# Patient Record
Sex: Male | Born: 1945 | Race: White | Hispanic: No | Marital: Married | Smoking: Former smoker
Health system: Southern US, Community
[De-identification: ages and names within clinical notes are randomized; demographics above are authoritative.]

## PROBLEM LIST (undated history)

## (undated) DIAGNOSIS — F419 Anxiety disorder, unspecified: Secondary | ICD-10-CM

## (undated) DIAGNOSIS — I209 Angina pectoris, unspecified: Secondary | ICD-10-CM

## (undated) DIAGNOSIS — E785 Hyperlipidemia, unspecified: Secondary | ICD-10-CM

## (undated) DIAGNOSIS — C801 Malignant (primary) neoplasm, unspecified: Secondary | ICD-10-CM

## (undated) DIAGNOSIS — R569 Unspecified convulsions: Secondary | ICD-10-CM

## (undated) DIAGNOSIS — I10 Essential (primary) hypertension: Secondary | ICD-10-CM

## (undated) DIAGNOSIS — F4310 Post-traumatic stress disorder, unspecified: Secondary | ICD-10-CM

## (undated) HISTORY — PX: CHOLECYSTECTOMY: SHX55

## (undated) HISTORY — DX: Essential (primary) hypertension: I10

## (undated) HISTORY — DX: Hyperlipidemia, unspecified: E78.5

## (undated) HISTORY — DX: Angina pectoris, unspecified: I20.9

## (undated) HISTORY — PX: CARDIAC CATHETERIZATION: SHX172

## (undated) HISTORY — DX: Unspecified convulsions: R56.9

---

## 2007-09-29 ENCOUNTER — Ambulatory Visit: Payer: Self-pay | Admitting: Family Medicine

## 2007-09-29 DIAGNOSIS — L299 Pruritus, unspecified: Secondary | ICD-10-CM | POA: Insufficient documentation

## 2007-09-29 DIAGNOSIS — L259 Unspecified contact dermatitis, unspecified cause: Secondary | ICD-10-CM

## 2007-10-12 ENCOUNTER — Ambulatory Visit: Payer: Self-pay | Admitting: Family Medicine

## 2007-10-15 ENCOUNTER — Telehealth: Payer: Self-pay | Admitting: Family Medicine

## 2008-05-31 ENCOUNTER — Ambulatory Visit: Payer: Self-pay | Admitting: Family Medicine

## 2008-05-31 DIAGNOSIS — L255 Unspecified contact dermatitis due to plants, except food: Secondary | ICD-10-CM

## 2008-06-05 ENCOUNTER — Telehealth: Payer: Self-pay | Admitting: Family Medicine

## 2008-06-06 ENCOUNTER — Encounter: Payer: Self-pay | Admitting: Family Medicine

## 2008-11-08 ENCOUNTER — Ambulatory Visit: Payer: Self-pay | Admitting: Family Medicine

## 2008-11-08 DIAGNOSIS — L570 Actinic keratosis: Secondary | ICD-10-CM

## 2008-12-20 ENCOUNTER — Ambulatory Visit: Payer: Self-pay | Admitting: Family Medicine

## 2008-12-20 DIAGNOSIS — L2089 Other atopic dermatitis: Secondary | ICD-10-CM

## 2008-12-21 ENCOUNTER — Encounter: Payer: Self-pay | Admitting: Family Medicine

## 2009-01-04 ENCOUNTER — Ambulatory Visit: Payer: Self-pay | Admitting: Family Medicine

## 2009-01-10 ENCOUNTER — Ambulatory Visit: Payer: Self-pay | Admitting: Family Medicine

## 2009-01-10 DIAGNOSIS — I209 Angina pectoris, unspecified: Secondary | ICD-10-CM

## 2009-01-10 DIAGNOSIS — R7301 Impaired fasting glucose: Secondary | ICD-10-CM

## 2009-01-10 DIAGNOSIS — I10 Essential (primary) hypertension: Secondary | ICD-10-CM

## 2009-01-15 ENCOUNTER — Encounter: Payer: Self-pay | Admitting: Family Medicine

## 2009-01-15 LAB — CONVERTED CEMR LAB
ALT: 39 units/L (ref 0–53)
Albumin: 4.6 g/dL (ref 3.5–5.2)
CO2: 23 meq/L (ref 19–32)
Chloride: 95 meq/L — ABNORMAL LOW (ref 96–112)
Cholesterol: 193 mg/dL (ref 0–200)
LDL Cholesterol: 85 mg/dL (ref 0–99)
MCV: 87 fL (ref 78.0–100.0)
PSA: 0.91 ng/mL (ref 0.10–4.00)
Platelets: 149 10*3/uL — ABNORMAL LOW (ref 150–400)
RDW: 12.3 % (ref 11.5–15.5)
Sodium: 132 meq/L — ABNORMAL LOW (ref 135–145)
Total Bilirubin: 0.5 mg/dL (ref 0.3–1.2)
Total Protein: 7.2 g/dL (ref 6.0–8.3)
Triglycerides: 316 mg/dL — ABNORMAL HIGH (ref <150)
VLDL: 63 mg/dL — ABNORMAL HIGH (ref 0–40)
WBC: 5.4 10*3/uL (ref 4.0–10.5)

## 2009-01-24 ENCOUNTER — Telehealth: Payer: Self-pay | Admitting: Family Medicine

## 2009-05-03 ENCOUNTER — Ambulatory Visit: Payer: Self-pay | Admitting: Family Medicine

## 2009-10-24 ENCOUNTER — Ambulatory Visit: Payer: Self-pay | Admitting: Family Medicine

## 2009-10-24 DIAGNOSIS — IMO0002 Reserved for concepts with insufficient information to code with codable children: Secondary | ICD-10-CM | POA: Insufficient documentation

## 2009-10-24 DIAGNOSIS — M7500 Adhesive capsulitis of unspecified shoulder: Secondary | ICD-10-CM

## 2009-10-27 ENCOUNTER — Encounter: Admission: RE | Admit: 2009-10-27 | Discharge: 2009-10-27 | Payer: Self-pay | Admitting: Sports Medicine

## 2010-10-08 NOTE — Assessment & Plan Note (Signed)
Summary: R SHOULDER PAIN/WB   Vital Signs:  Patient Profile:   65 Years Old Male CC:      Rt shoulder pain from fall Christmas day Height:     66 inches Weight:      191 pounds O2 Sat:      98 % O2 treatment:    Room Air Temp:     97.7 degrees F oral Pulse rate:   47 / minute Pulse rhythm:   regular Resp:     16 per minute BP sitting:   147 / 83  (left arm) Cuff size:   regular  Pt. in pain?   yes    Location:   shoulder    Intensity:   8    Type:       aching  Vitals Entered By: Avel Sensor, CMA                   Prior Medication List:  TEGRETOL XR 200 MG  TB12 (CARBAMAZEPINE) take three tabs by mouth two times a day LEVETIRACETAM 500 MG  TABS (LEVETIRACETAM) take two tabs by mouth two times a day CLONAZEPAM 1 MG  TABS (CLONAZEPAM) Take 1 tablet by mouth two times a day ATENOLOL 100 MG  TABS (ATENOLOL) Take 1 tablet by mouth once a day SIMVASTATIN 80 MG  TABS (SIMVASTATIN) Take 1 tablet by mouth once a day NITRO-DUR 0.4 MG/HR  PT24 (NITROGLYCERIN) apply patch daily VENLAFAXINE HCL 75 MG  TABS (VENLAFAXINE HCL) take three tabs by mouth daily * EUCERIN: TRIAMCINOLONE 0.5% CREAM 1:1 apoly to rash two times a day x 7 days HYDROXYZINE HCL 50 MG TABS (HYDROXYZINE HCL) 1-2 tabs by mouth at bedtime as needed itching   Current Allergies (reviewed today): ! HYDROCODONEHistory of Present Illness Chief Complaint: Rt shoulder pain from fall Christmas day History of Present Illness: Patient fell almost 2 months . He reports pain w/ a throwing motion. He still has limited ability in his R shoulder.   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) DERMATITIS, ATOPIC (ICD-691.8) DERMATITIS, CHRONIC (ICD-692.9) ACTINIC KERATOSIS (ICD-702.0) POISON IVY  DERMATITIS (ICD-692.6) CONTACT DERMATITIS (ICD-692.9) PRURITUS (ICD-698.9)   Current Meds TEGRETOL XR 200 MG  TB12 (CARBAMAZEPINE) take three tabs by mouth two times a day LEVETIRACETAM 500 MG  TABS (LEVETIRACETAM) take two tabs by mouth two times a day CLONAZEPAM 1 MG  TABS (CLONAZEPAM) Take 1 tablet by mouth two times a day ATENOLOL 100 MG  TABS (ATENOLOL) Take 1 tablet by mouth once a day SIMVASTATIN 80 MG  TABS (SIMVASTATIN) Take 1 tablet by mouth once a day NITRO-DUR 0.4 MG/HR  PT24 (NITROGLYCERIN) apply patch daily VENLAFAXINE HCL 75 MG  TABS (VENLAFAXINE HCL) take three tabs by mouth daily * EUCERIN: TRIAMCINOLONE 0.5% CREAM 1:1 apoly to rash two times a day x 7 days HYDROXYZINE HCL 50 MG TABS (HYDROXYZINE HCL) 1-2 tabs by mouth at bedtime as needed itching OMEPRAZOLE 20 MG CPDR (OMEPRAZOLE)  FLUNISOLIDE 0.025 % SOLN (FLUNISOLIDE)  CRESTOR 10 MG TABS (ROSUVASTATIN CALCIUM)   REVIEW OF SYSTEMS Constitutional Symptoms      Denies fever, chills, night sweats, weight loss, weight gain, and fatigue.  Eyes       Denies change in vision, eye pain, eye discharge, glasses, contact lenses, and eye surgery. Ear/Nose/Throat/Mouth       Denies hearing loss/aids, change in hearing,  ear pain, ear discharge, dizziness, frequent runny nose, frequent nose bleeds, sinus problems, sore throat, hoarseness, and tooth pain or bleeding.  Respiratory       Denies dry cough, productive cough, wheezing, asthma, bronchitis, and emphysema/COPD.  Cardiovascular       Denies murmurs, chest pain, and tires easily with exhertion.    Gastrointestinal       Denies stomach pain, nausea/vomiting, diarrhea, constipation, blood in bowel movements, and indigestion. Genitourniary       Denies painful urination, kidney stones, and loss of urinary control. Neurological       Denies paralysis, seizures, and fainting/blackouts. Musculoskeletal       Complains of muscle pain, joint pain, joint stiffness, and  decreased range of motion.      Denies redness, swelling, muscle weakness, and gout.  Skin       Denies bruising, unusual mles/lumps or sores, and hair/skin or nail changes.  Psych       Denies mood changes, temper/anger issues, anxiety/stress, speech problems, depression, and sleep problems.  Past History:  Family History: Last updated: 10/25/2007 mother died 89 old age father died 55, AMI, DM 2 brothers, 1 died, unknown cause 3 sisters - 1 w/ DM, 1 w/ colon cancer and skin cancer  Social History: Last updated: 10/25/07 Retired from [employer]  in [place] Married to [place]. No kids. Has 2 dogs. Quit smoking 15 yrs ago. Denies ETOH. Poor diet. No exericse.  Risk Factors: Caffeine Use: 2 (10/25/2007)  Risk Factors: Smoking Status: quit (10/25/2007)  Past Medical History: Reviewed history from 12/20/2008 and no changes required. DM, diet- controlled. Epilepsy -- neuro in [place] High chol HTN Angina  goes to [place]  Past Surgical History: Reviewed history from Oct 25, 2007 and no changes required. heart cath 1997, no stent or angio  Family History: Reviewed history from 2007-10-25 and no changes required. mother died 27 old age father died 39, AMI, DM 2 brothers, 1 died, unknown cause 3 sisters - 1 w/ DM, 1 w/ colon cancer and skin cancer  Social History: Reviewed history from 10/25/07 and no changes required. Retired from [employer]  in [place] Married to [place]. No kids. Has 2 dogs. Quit smoking 15 yrs ago. Denies ETOH. Poor diet. No exericse. 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  Plan New Orders: New Patient Level III [99203] T-DG Shoulder*R* [73030] Follow Up: tomorrow w/orthopedic  The patient and/or caregiver has been counseled thoroughly with regard to medications prescribed including dosage, schedule, interactions, rationale for use, and possible side effects and they verbalize understanding.  Diagnoses and expected course of recovery discussed and will return if not improved as expected or if the condition worsens. Patient and/or caregiver verbalized understanding.   Patient Instructions: 1)  folow up w/ Ortho tomorrow.

## 2010-10-16 ENCOUNTER — Encounter: Payer: Self-pay | Admitting: Family Medicine

## 2010-10-16 ENCOUNTER — Other Ambulatory Visit: Payer: Self-pay | Admitting: Family Medicine

## 2010-10-16 ENCOUNTER — Ambulatory Visit (INDEPENDENT_AMBULATORY_CARE_PROVIDER_SITE_OTHER): Payer: Medicare Other | Admitting: Family Medicine

## 2010-10-16 ENCOUNTER — Ambulatory Visit
Admission: RE | Admit: 2010-10-16 | Discharge: 2010-10-16 | Disposition: A | Discharge status: Home / Self Care | Payer: Medicare Other | Source: Ambulatory Visit | Attending: Family Medicine | Admitting: Family Medicine

## 2010-10-16 DIAGNOSIS — R05 Cough: Secondary | ICD-10-CM | POA: Insufficient documentation

## 2010-10-24 NOTE — Assessment & Plan Note (Signed)
Summary: cough   Vital Signs:  Patient profile:   65 year old male Height:      66 inches Weight:      194 pounds BMI:     31.43 O2 Sat:      97 % on Room air Temp:     98.4 degrees F oral Pulse rate:   51 / minute BP sitting:   129 / 95  (left arm) Cuff size:   large  Vitals Entered By: Payton Spark CMA (October 16, 2010 2:36 PM)  O2 Flow:  Room air CC: Nasal congestion, fatigue and cough x 6 weeks.   Primary Care Provider:  Seymour Bars DO  CC:  Nasal congestion and fatigue and cough x 6 weeks.Marland Kitchen  History of Present Illness: 65 yo WM presents for a cough that started 6 wks ago.  His wife started with a cold and he says that his symptoms started after hers.  He has been coughing so hard that his ribs hurt.  He has not been seen by a doctor yet.  He started an OTC cough medicine but it made him feel funny, so he stopped it.  Has a runny nose and has a postnasal drip.  Occas HA but no facial pain or ear pain.  He has not had a fever or chills.  Denies GI upset.  Denies any problems eating.  He has been feeling weak with no energy.  Has SOB only with coughing fits.  Not sleeping well due to cough.  He is producting a green phlegm.    Allergies: 1)  ! Hydrocodone  Past History:  Past Medical History: Reviewed history from 12/20/2008 and no changes required. DM, diet- controlled. Epilepsy -- neuro in [facility] High chol HTN Angina  goes to [place]  Social History: Reviewed history from 09/29/2007 and no changes required. Retired from [employer]  in [place] Married to [place]. No kids. Has 2 dogs. Quit smoking 15 yrs ago. Denies ETOH. Poor diet. No exericse.  Review of Systems      See HPI  Physical Exam  General:  alert, well-developed, well-nourished, well-hydrated, and overweight-appearing.   Head:  normocephalic and atraumatic.  sinuses NTTP Eyes:  conjunctiva clear Ears:  EACs patent; TMs translucent and gray with good cone of light and bony  landmarks.  Nose:  scant nasal congestion with boggy turbinates Mouth:  o/p mildly injected with postnasal drip Neck:  no masses.   Chest Wall:  no tenderness.   Lungs:  Normal respiratory effort, chest expands symmetrically. Lungs are clear to auscultation, no crackles or wheezes.  dry cough Heart:  Normal rate and regular rhythm. S1 and S2 normal without gallop, murmur, click, rub or other extra sounds. Extremities:  no E/C/C Skin:  fair skinned with papular rash on the arms, foreams and legs.  No vesicles.  No edema Cervical Nodes:  No lymphadenopathy noted   Impression & Recommendations:  Problem # 1:  COUGH (ICD-786.2) Prolonged cough x 6 wks with fairly normal lung exam and pulse ox.  This could be from postnasal drip or triggered by reflux.  He is not on an ACEi.   Will treat with Zithromax given prolonged duration of cough with constitutional symptoms but will confirm with CXR tdoay to make sure no CHF or lung masses.  Tessalon given for cough.  OK to use OTC Zyrtect to aid with congestion (mucinex -d was causing a lot of SEs).   Orders: T-Chest x-ray, 2 views (71020)  Complete  Medication List: 1)  Tegretol Xr 200 Mg Tb12 (Carbamazepine) .... Take three tabs by mouth two times a day 2)  Levetiracetam 500 Mg Tabs (Levetiracetam) .... Take two tabs by mouth two times a day 3)  Clonazepam 1 Mg Tabs (Clonazepam) .... Take 1 tablet by mouth two times a day 4)  Atenolol 100 Mg Tabs (Atenolol) .... Take 1 tablet by mouth once a day 5)  Simvastatin 80 Mg Tabs (Simvastatin) .... Take 1 tablet by mouth once a day 6)  Nitro-dur 0.4 Mg/hr Pt24 (Nitroglycerin) .... Apply patch daily 7)  Venlafaxine Hcl 75 Mg Tabs (Venlafaxine hcl) .... Take three tabs by mouth daily 8)  Eucerin: Triamcinolone 0.5% Cream 1:1  .... Apoly to rash two times a day x 7 days 9)  Hydroxyzine Hcl 50 Mg Tabs (Hydroxyzine hcl) .Marland Kitchen.. 1-2 tabs by mouth at bedtime as needed itching 10)  Omeprazole 20 Mg Cpdr  (Omeprazole) 11)  Flunisolide 0.025 % Soln (Flunisolide) 12)  Crestor 10 Mg Tabs (Rosuvastatin calcium) 13)  Zithromax Z-pak 250 Mg Tabs (Azithromycin) .... 2 tabs by mouth x 1 day then 1 tab by mouth daily x 4 days 14)  Tessalon 200 Mg Caps (Benzonatate) .Marland Kitchen.. 1 capsule by mouth three times a day as needed cough  Patient Instructions: 1)  CXR today. 2)  Start Zithromax today and take x 5 days (antibiotic). 3)  Use Tessalon as needed for cough. 4)  Add Zyrtec OTC each night for congestion. 5)  Return in 10 days for f/u cough/ BP. Prescriptions: TESSALON 200 MG CAPS (BENZONATATE) 1 capsule by mouth three times a day as needed cough  #30 x 0   Entered and Authorized by:   Seymour Bars DO   Signed by:   Seymour Bars DO on 10/16/2010   Method used:   Electronically to        [facility] [phone]* (retail)       [address] [place], [place]  [address]       Ph: [phone]       Fax: [phone]   RxID:   [id number] ZITHROMAX Z-PAK 250 MG TABS (AZITHROMYCIN) 2 tabs by mouth x 1 day then 1 tab by mouth daily x 4 days  #1 pack x 0   Entered and Authorized by:   Seymour Bars DO   Signed by:   Seymour Bars DO on 10/16/2010   Method used:   Electronically to        [facility] [phone]* (retail)       [address] [place], [place]  [address]       Ph: [phone]       Fax: [phone]   RxID:   [id number]    Orders Added: 1)  T-Chest x-ray, 2 views [71020] 2)  Est. Patient Level III [30160]

## 2010-10-30 ENCOUNTER — Ambulatory Visit: Payer: Medicare Other | Admitting: Family Medicine

## 2011-05-25 ENCOUNTER — Encounter: Payer: Self-pay | Admitting: Family Medicine

## 2011-05-26 ENCOUNTER — Ambulatory Visit: Payer: Medicare Other | Admitting: Family Medicine

## 2011-08-13 DIAGNOSIS — F4310 Post-traumatic stress disorder, unspecified: Secondary | ICD-10-CM | POA: Diagnosis not present

## 2011-08-13 DIAGNOSIS — E119 Type 2 diabetes mellitus without complications: Secondary | ICD-10-CM | POA: Diagnosis not present

## 2011-08-13 DIAGNOSIS — G40909 Epilepsy, unspecified, not intractable, without status epilepticus: Secondary | ICD-10-CM | POA: Diagnosis not present

## 2011-08-13 DIAGNOSIS — I10 Essential (primary) hypertension: Secondary | ICD-10-CM | POA: Diagnosis not present

## 2011-08-13 DIAGNOSIS — S069X9S Unspecified intracranial injury with loss of consciousness of unspecified duration, sequela: Secondary | ICD-10-CM | POA: Diagnosis not present

## 2011-08-13 DIAGNOSIS — G8190 Hemiplegia, unspecified affecting unspecified side: Secondary | ICD-10-CM | POA: Diagnosis not present

## 2011-08-14 DIAGNOSIS — E119 Type 2 diabetes mellitus without complications: Secondary | ICD-10-CM | POA: Diagnosis not present

## 2011-08-14 DIAGNOSIS — S069X9S Unspecified intracranial injury with loss of consciousness of unspecified duration, sequela: Secondary | ICD-10-CM | POA: Diagnosis not present

## 2011-08-14 DIAGNOSIS — G8190 Hemiplegia, unspecified affecting unspecified side: Secondary | ICD-10-CM | POA: Diagnosis not present

## 2011-08-14 DIAGNOSIS — I10 Essential (primary) hypertension: Secondary | ICD-10-CM | POA: Diagnosis not present

## 2011-08-14 DIAGNOSIS — F4310 Post-traumatic stress disorder, unspecified: Secondary | ICD-10-CM | POA: Diagnosis not present

## 2011-08-14 DIAGNOSIS — G40909 Epilepsy, unspecified, not intractable, without status epilepticus: Secondary | ICD-10-CM | POA: Diagnosis not present

## 2011-08-15 DIAGNOSIS — G40909 Epilepsy, unspecified, not intractable, without status epilepticus: Secondary | ICD-10-CM | POA: Diagnosis not present

## 2011-08-15 DIAGNOSIS — F4310 Post-traumatic stress disorder, unspecified: Secondary | ICD-10-CM | POA: Diagnosis not present

## 2011-08-15 DIAGNOSIS — I10 Essential (primary) hypertension: Secondary | ICD-10-CM | POA: Diagnosis not present

## 2011-08-15 DIAGNOSIS — G8190 Hemiplegia, unspecified affecting unspecified side: Secondary | ICD-10-CM | POA: Diagnosis not present

## 2011-08-15 DIAGNOSIS — S069X9S Unspecified intracranial injury with loss of consciousness of unspecified duration, sequela: Secondary | ICD-10-CM | POA: Diagnosis not present

## 2011-08-15 DIAGNOSIS — E119 Type 2 diabetes mellitus without complications: Secondary | ICD-10-CM | POA: Diagnosis not present

## 2011-08-16 DIAGNOSIS — E119 Type 2 diabetes mellitus without complications: Secondary | ICD-10-CM | POA: Diagnosis not present

## 2011-08-16 DIAGNOSIS — F4310 Post-traumatic stress disorder, unspecified: Secondary | ICD-10-CM | POA: Diagnosis not present

## 2011-08-16 DIAGNOSIS — I10 Essential (primary) hypertension: Secondary | ICD-10-CM | POA: Diagnosis not present

## 2011-08-16 DIAGNOSIS — G8190 Hemiplegia, unspecified affecting unspecified side: Secondary | ICD-10-CM | POA: Diagnosis not present

## 2011-08-16 DIAGNOSIS — S069X9S Unspecified intracranial injury with loss of consciousness of unspecified duration, sequela: Secondary | ICD-10-CM | POA: Diagnosis not present

## 2011-08-16 DIAGNOSIS — G40909 Epilepsy, unspecified, not intractable, without status epilepticus: Secondary | ICD-10-CM | POA: Diagnosis not present

## 2011-08-18 DIAGNOSIS — G40909 Epilepsy, unspecified, not intractable, without status epilepticus: Secondary | ICD-10-CM | POA: Diagnosis not present

## 2011-08-18 DIAGNOSIS — I10 Essential (primary) hypertension: Secondary | ICD-10-CM | POA: Diagnosis not present

## 2011-08-18 DIAGNOSIS — E119 Type 2 diabetes mellitus without complications: Secondary | ICD-10-CM | POA: Diagnosis not present

## 2011-08-18 DIAGNOSIS — G8190 Hemiplegia, unspecified affecting unspecified side: Secondary | ICD-10-CM | POA: Diagnosis not present

## 2011-08-18 DIAGNOSIS — S069X9S Unspecified intracranial injury with loss of consciousness of unspecified duration, sequela: Secondary | ICD-10-CM | POA: Diagnosis not present

## 2011-08-18 DIAGNOSIS — F4310 Post-traumatic stress disorder, unspecified: Secondary | ICD-10-CM | POA: Diagnosis not present

## 2011-08-21 DIAGNOSIS — G40909 Epilepsy, unspecified, not intractable, without status epilepticus: Secondary | ICD-10-CM | POA: Diagnosis not present

## 2011-08-21 DIAGNOSIS — F4310 Post-traumatic stress disorder, unspecified: Secondary | ICD-10-CM | POA: Diagnosis not present

## 2011-08-21 DIAGNOSIS — E119 Type 2 diabetes mellitus without complications: Secondary | ICD-10-CM | POA: Diagnosis not present

## 2011-08-21 DIAGNOSIS — I10 Essential (primary) hypertension: Secondary | ICD-10-CM | POA: Diagnosis not present

## 2011-08-21 DIAGNOSIS — G8190 Hemiplegia, unspecified affecting unspecified side: Secondary | ICD-10-CM | POA: Diagnosis not present

## 2011-08-21 DIAGNOSIS — S069X9S Unspecified intracranial injury with loss of consciousness of unspecified duration, sequela: Secondary | ICD-10-CM | POA: Diagnosis not present

## 2011-08-22 DIAGNOSIS — I10 Essential (primary) hypertension: Secondary | ICD-10-CM | POA: Diagnosis not present

## 2011-08-22 DIAGNOSIS — E119 Type 2 diabetes mellitus without complications: Secondary | ICD-10-CM | POA: Diagnosis not present

## 2011-08-22 DIAGNOSIS — G40909 Epilepsy, unspecified, not intractable, without status epilepticus: Secondary | ICD-10-CM | POA: Diagnosis not present

## 2011-08-22 DIAGNOSIS — S069X9S Unspecified intracranial injury with loss of consciousness of unspecified duration, sequela: Secondary | ICD-10-CM | POA: Diagnosis not present

## 2011-08-22 DIAGNOSIS — F4310 Post-traumatic stress disorder, unspecified: Secondary | ICD-10-CM | POA: Diagnosis not present

## 2011-08-22 DIAGNOSIS — G8190 Hemiplegia, unspecified affecting unspecified side: Secondary | ICD-10-CM | POA: Diagnosis not present

## 2011-08-26 DIAGNOSIS — G8190 Hemiplegia, unspecified affecting unspecified side: Secondary | ICD-10-CM | POA: Diagnosis not present

## 2011-08-26 DIAGNOSIS — G40909 Epilepsy, unspecified, not intractable, without status epilepticus: Secondary | ICD-10-CM | POA: Diagnosis not present

## 2011-08-26 DIAGNOSIS — E119 Type 2 diabetes mellitus without complications: Secondary | ICD-10-CM | POA: Diagnosis not present

## 2011-08-26 DIAGNOSIS — F4310 Post-traumatic stress disorder, unspecified: Secondary | ICD-10-CM | POA: Diagnosis not present

## 2011-08-26 DIAGNOSIS — S069X9S Unspecified intracranial injury with loss of consciousness of unspecified duration, sequela: Secondary | ICD-10-CM | POA: Diagnosis not present

## 2011-08-26 DIAGNOSIS — I10 Essential (primary) hypertension: Secondary | ICD-10-CM | POA: Diagnosis not present

## 2011-08-27 DIAGNOSIS — I10 Essential (primary) hypertension: Secondary | ICD-10-CM | POA: Diagnosis not present

## 2011-08-27 DIAGNOSIS — E119 Type 2 diabetes mellitus without complications: Secondary | ICD-10-CM | POA: Diagnosis not present

## 2011-08-27 DIAGNOSIS — G8190 Hemiplegia, unspecified affecting unspecified side: Secondary | ICD-10-CM | POA: Diagnosis not present

## 2011-08-27 DIAGNOSIS — S069X9S Unspecified intracranial injury with loss of consciousness of unspecified duration, sequela: Secondary | ICD-10-CM | POA: Diagnosis not present

## 2011-08-27 DIAGNOSIS — F4310 Post-traumatic stress disorder, unspecified: Secondary | ICD-10-CM | POA: Diagnosis not present

## 2011-08-27 DIAGNOSIS — G40909 Epilepsy, unspecified, not intractable, without status epilepticus: Secondary | ICD-10-CM | POA: Diagnosis not present

## 2011-08-29 DIAGNOSIS — G8190 Hemiplegia, unspecified affecting unspecified side: Secondary | ICD-10-CM | POA: Diagnosis not present

## 2011-08-29 DIAGNOSIS — S069X9S Unspecified intracranial injury with loss of consciousness of unspecified duration, sequela: Secondary | ICD-10-CM | POA: Diagnosis not present

## 2011-08-29 DIAGNOSIS — F4310 Post-traumatic stress disorder, unspecified: Secondary | ICD-10-CM | POA: Diagnosis not present

## 2011-08-29 DIAGNOSIS — I10 Essential (primary) hypertension: Secondary | ICD-10-CM | POA: Diagnosis not present

## 2011-08-29 DIAGNOSIS — E119 Type 2 diabetes mellitus without complications: Secondary | ICD-10-CM | POA: Diagnosis not present

## 2011-08-29 DIAGNOSIS — G40909 Epilepsy, unspecified, not intractable, without status epilepticus: Secondary | ICD-10-CM | POA: Diagnosis not present

## 2011-09-03 DIAGNOSIS — F4310 Post-traumatic stress disorder, unspecified: Secondary | ICD-10-CM | POA: Diagnosis not present

## 2011-09-03 DIAGNOSIS — G8190 Hemiplegia, unspecified affecting unspecified side: Secondary | ICD-10-CM | POA: Diagnosis not present

## 2011-09-03 DIAGNOSIS — S069X9S Unspecified intracranial injury with loss of consciousness of unspecified duration, sequela: Secondary | ICD-10-CM | POA: Diagnosis not present

## 2011-09-03 DIAGNOSIS — E119 Type 2 diabetes mellitus without complications: Secondary | ICD-10-CM | POA: Diagnosis not present

## 2011-09-03 DIAGNOSIS — I10 Essential (primary) hypertension: Secondary | ICD-10-CM | POA: Diagnosis not present

## 2011-09-03 DIAGNOSIS — G40909 Epilepsy, unspecified, not intractable, without status epilepticus: Secondary | ICD-10-CM | POA: Diagnosis not present

## 2011-09-05 DIAGNOSIS — I10 Essential (primary) hypertension: Secondary | ICD-10-CM | POA: Diagnosis not present

## 2011-09-05 DIAGNOSIS — S069X9S Unspecified intracranial injury with loss of consciousness of unspecified duration, sequela: Secondary | ICD-10-CM | POA: Diagnosis not present

## 2011-09-05 DIAGNOSIS — G8190 Hemiplegia, unspecified affecting unspecified side: Secondary | ICD-10-CM | POA: Diagnosis not present

## 2011-09-05 DIAGNOSIS — E119 Type 2 diabetes mellitus without complications: Secondary | ICD-10-CM | POA: Diagnosis not present

## 2011-09-05 DIAGNOSIS — F4310 Post-traumatic stress disorder, unspecified: Secondary | ICD-10-CM | POA: Diagnosis not present

## 2011-09-05 DIAGNOSIS — G40909 Epilepsy, unspecified, not intractable, without status epilepticus: Secondary | ICD-10-CM | POA: Diagnosis not present

## 2011-09-09 DIAGNOSIS — G40909 Epilepsy, unspecified, not intractable, without status epilepticus: Secondary | ICD-10-CM | POA: Diagnosis not present

## 2011-09-09 DIAGNOSIS — I10 Essential (primary) hypertension: Secondary | ICD-10-CM | POA: Diagnosis not present

## 2011-09-09 DIAGNOSIS — E119 Type 2 diabetes mellitus without complications: Secondary | ICD-10-CM | POA: Diagnosis not present

## 2011-09-09 DIAGNOSIS — S069X9S Unspecified intracranial injury with loss of consciousness of unspecified duration, sequela: Secondary | ICD-10-CM | POA: Diagnosis not present

## 2011-09-09 DIAGNOSIS — F4310 Post-traumatic stress disorder, unspecified: Secondary | ICD-10-CM | POA: Diagnosis not present

## 2011-09-09 DIAGNOSIS — G8190 Hemiplegia, unspecified affecting unspecified side: Secondary | ICD-10-CM | POA: Diagnosis not present

## 2011-09-12 DIAGNOSIS — E119 Type 2 diabetes mellitus without complications: Secondary | ICD-10-CM | POA: Diagnosis not present

## 2011-09-12 DIAGNOSIS — F4310 Post-traumatic stress disorder, unspecified: Secondary | ICD-10-CM | POA: Diagnosis not present

## 2011-09-12 DIAGNOSIS — S069X9S Unspecified intracranial injury with loss of consciousness of unspecified duration, sequela: Secondary | ICD-10-CM | POA: Diagnosis not present

## 2011-09-12 DIAGNOSIS — G40909 Epilepsy, unspecified, not intractable, without status epilepticus: Secondary | ICD-10-CM | POA: Diagnosis not present

## 2011-09-12 DIAGNOSIS — G8190 Hemiplegia, unspecified affecting unspecified side: Secondary | ICD-10-CM | POA: Diagnosis not present

## 2011-09-12 DIAGNOSIS — I10 Essential (primary) hypertension: Secondary | ICD-10-CM | POA: Diagnosis not present

## 2011-09-18 DIAGNOSIS — I10 Essential (primary) hypertension: Secondary | ICD-10-CM | POA: Diagnosis not present

## 2011-09-18 DIAGNOSIS — F4310 Post-traumatic stress disorder, unspecified: Secondary | ICD-10-CM | POA: Diagnosis not present

## 2011-09-18 DIAGNOSIS — R569 Unspecified convulsions: Secondary | ICD-10-CM | POA: Diagnosis not present

## 2011-09-18 DIAGNOSIS — IMO0002 Reserved for concepts with insufficient information to code with codable children: Secondary | ICD-10-CM | POA: Diagnosis not present

## 2011-09-18 DIAGNOSIS — I619 Nontraumatic intracerebral hemorrhage, unspecified: Secondary | ICD-10-CM | POA: Diagnosis not present

## 2011-12-08 DIAGNOSIS — F4310 Post-traumatic stress disorder, unspecified: Secondary | ICD-10-CM | POA: Diagnosis not present

## 2011-12-08 DIAGNOSIS — I10 Essential (primary) hypertension: Secondary | ICD-10-CM | POA: Diagnosis not present

## 2011-12-08 DIAGNOSIS — R569 Unspecified convulsions: Secondary | ICD-10-CM | POA: Diagnosis not present

## 2011-12-08 DIAGNOSIS — IMO0002 Reserved for concepts with insufficient information to code with codable children: Secondary | ICD-10-CM | POA: Diagnosis not present

## 2011-12-08 DIAGNOSIS — I619 Nontraumatic intracerebral hemorrhage, unspecified: Secondary | ICD-10-CM | POA: Diagnosis not present

## 2011-12-16 DIAGNOSIS — E119 Type 2 diabetes mellitus without complications: Secondary | ICD-10-CM | POA: Diagnosis not present

## 2013-01-11 DIAGNOSIS — I629 Nontraumatic intracranial hemorrhage, unspecified: Secondary | ICD-10-CM | POA: Diagnosis not present

## 2013-01-11 DIAGNOSIS — I6200 Nontraumatic subdural hemorrhage, unspecified: Secondary | ICD-10-CM | POA: Diagnosis not present

## 2013-01-13 DIAGNOSIS — H35039 Hypertensive retinopathy, unspecified eye: Secondary | ICD-10-CM | POA: Diagnosis not present

## 2013-03-03 ENCOUNTER — Encounter: Payer: Self-pay | Admitting: *Deleted

## 2013-03-03 ENCOUNTER — Emergency Department (INDEPENDENT_AMBULATORY_CARE_PROVIDER_SITE_OTHER)
Admission: EM | Admit: 2013-03-03 | Discharge: 2013-03-03 | Disposition: A | Discharge status: Home / Self Care | Payer: Medicare Other | Source: Home / Self Care | Attending: Family Medicine | Admitting: Family Medicine

## 2013-03-03 DIAGNOSIS — L0390 Cellulitis, unspecified: Secondary | ICD-10-CM

## 2013-03-03 DIAGNOSIS — IMO0001 Reserved for inherently not codable concepts without codable children: Secondary | ICD-10-CM | POA: Diagnosis not present

## 2013-03-03 DIAGNOSIS — Z23 Encounter for immunization: Secondary | ICD-10-CM

## 2013-03-03 HISTORY — DX: Malignant (primary) neoplasm, unspecified: C80.1

## 2013-03-03 MED ORDER — TETANUS-DIPHTH-ACELL PERTUSSIS 5-2.5-18.5 LF-MCG/0.5 IM SUSP
0.5000 mL | Freq: Once | INTRAMUSCULAR | Status: AC
  Administered 2013-03-03: 0.5 mL via INTRAMUSCULAR

## 2013-03-03 MED ORDER — DOXYCYCLINE HYCLATE 100 MG PO CAPS: 100.0000 mg | ORAL_CAPSULE | Freq: Two times a day (BID) | ORAL | Status: DC

## 2013-03-03 NOTE — ED Provider Notes (Signed)
History    CSN: [id number] Arrival date & time 03/03/13  1518  First MD Initiated Contact with Patient 03/03/13 1553     Chief Complaint  Patient presents with  . Insect Bite    wasp sting      HPI Comments: Patient was stung by a wasp on his left forearm two days ago.  The area has become increasingly red, warm, swollen, and tender over the past 24 hours.  The area is pruritic, especially at night.  He feels well otherwise.  No chest pain, shortness of breath, or difficulty swallowing.  No fevers, chills, and sweats.  He does not remember his last Tdap.  Patient is a 67 y.o. male presenting with trauma. The history is provided by the patient.  Trauma Mechanism of injury: wasp sting Injury location: shoulder/arm Injury location detail: L forearm Incident location: home Time since incident: 2 days   Current symptoms:      Quality: itching.      Pain timing: constant      Associated symptoms:            Denies abdominal pain, chest pain, difficulty breathing, headache, nausea and vomiting.   Relevant PMH:      Tetanus status: out of date  Past Medical History  Diagnosis Date  . Diabetes mellitus   . Seizures   . Hyperlipidemia   . Hypertension   . Acute angina   . Cancer     skin CA   Past Surgical History  Procedure Laterality Date  . Cardiac catheterization    . Cholecystectomy     Family History  Problem Relation Age of Onset  . Diabetes Father   . Heart attack Father   . Diabetes Sister   . Cancer Sister   . Cancer Sister    History  Substance Use Topics  . Smoking status: Former Smoker    Quit date: 09/09/1995  . Smokeless tobacco: Never Used  . Alcohol Use: No    Review of Systems  Cardiovascular: Negative for chest pain.  Gastrointestinal: Negative for nausea, vomiting and abdominal pain.  Neurological: Negative for headaches.  All other systems reviewed and are negative.    Allergies  Hydrocodone  Home Medications   Current Outpatient  Rx  Name  Route  Sig  Dispense  Refill  . aspirin 81 MG tablet   Oral   Take 81 mg by mouth daily.         Marland Kitchen atenolol (TENORMIN) 100 MG tablet   Oral   Take 100 mg by mouth daily.           Marland Kitchen atorvastatin (LIPITOR) 80 MG tablet   Oral   Take 80 mg by mouth daily.         . carbamazepine (TEGRETOL XR) 200 MG 12 hr tablet   Oral   Take 600 mg by mouth 2 (two) times daily.           . clonazePAM (KLONOPIN) 1 MG tablet   Oral   Take 1 mg by mouth 2 (two) times daily as needed.           . fish oil-omega-3 fatty acids 1000 MG capsule   Oral   Take 2 g by mouth daily.         . flunisolide (NASALIDE) 0.025 % SOLN   Inhalation   Inhale 2 sprays into the lungs 2 (two) times daily.           Marland Kitchen  hydrOXYzine (ATARAX) 50 MG tablet   Oral   Take 50 mg by mouth at bedtime as needed.           . levETIRAcetam (KEPPRA) 500 MG tablet   Oral   Take 1,000 mg by mouth every 12 (twelve) hours.           . nitroGLYCERIN (NITRODUR - DOSED IN MG/24 HR) 0.4 mg/hr   Transdermal   Place 1 patch onto the skin daily.           Marland Kitchen omeprazole (PRILOSEC) 20 MG capsule   Oral   Take 20 mg by mouth daily.           . Skin Protectants, Misc. (EUCERIN) cream   Topical   Apply topically 2 (two) times daily as needed.           . venlafaxine (EFFEXOR) 75 MG tablet   Oral   Take 225 mg by mouth daily.           Marland Kitchen doxycycline (VIBRAMYCIN) 100 MG capsule   Oral   Take 1 capsule (100 mg total) by mouth 2 (two) times daily.   20 capsule   0    BP 151/79  Pulse 75  Temp(Src) 98.4 F (36.9 C) (Oral)  Resp 14  Ht 5\' 7"  (1.702 m)  Wt 192 lb (87.091 kg)  BMI 30.06 kg/m2  SpO2 96% Physical Exam  Nursing note and vitals reviewed. Constitutional: He is oriented to person, place, and time. He appears well-developed and well-nourished. No distress.  Patient is obese (BMI 30.1)  HENT:  Head: Atraumatic.  Mouth/Throat: Oropharynx is clear and moist.  Eyes: Conjunctivae  are normal. Pupils are equal, round, and reactive to light.  Cardiovascular: Normal heart sounds.   Pulmonary/Chest: Breath sounds normal.  Abdominal: There is no tenderness.  Musculoskeletal: He exhibits tenderness.       Arms: Left forearm has a large tattoo on volar aspect as noted on photo.  There is an erythematous tender area at edge of tattoo.  Forearm is warm and slightly swollen.  Distal neurovascular function is intact.   Lymphadenopathy:    He has no cervical adenopathy.  Neurological: He is alert and oriented to person, place, and time.  Skin: Skin is warm and dry. There is erythema.            ED Course  Procedures  none   1. Wasp sting, initial encounter   2. Cellulitis     MDM  Tdap given.  Begin doxycycline.  Continue Benadryl Return if not improving 2 to 3 days.  Lattie Haw, MD 03/04/13 1006

## 2013-03-03 NOTE — ED Notes (Signed)
[place] c/o wasp sting to his LFA x 2 days. Site is red and edematous with induration. Denies fever/chills. Last tetanus unknown.

## 2013-03-04 ENCOUNTER — Telehealth: Payer: Self-pay | Admitting: *Deleted

## 2013-04-01 ENCOUNTER — Emergency Department (INDEPENDENT_AMBULATORY_CARE_PROVIDER_SITE_OTHER)
Admission: EM | Admit: 2013-04-01 | Discharge: 2013-04-01 | Disposition: A | Discharge status: Home / Self Care | Payer: Medicare Other | Source: Home / Self Care | Attending: Family Medicine | Admitting: Family Medicine

## 2013-04-01 ENCOUNTER — Encounter: Payer: Self-pay | Admitting: *Deleted

## 2013-04-01 DIAGNOSIS — IMO0002 Reserved for concepts with insufficient information to code with codable children: Secondary | ICD-10-CM | POA: Diagnosis not present

## 2013-04-01 DIAGNOSIS — L259 Unspecified contact dermatitis, unspecified cause: Secondary | ICD-10-CM | POA: Diagnosis not present

## 2013-04-01 MED ORDER — TRIAMCINOLONE ACETONIDE 40 MG/ML IJ SUSP
40.0000 mg | Freq: Once | INTRAMUSCULAR | Status: AC
  Administered 2013-04-01: 40 mg via INTRAMUSCULAR

## 2013-04-01 MED ORDER — DOMEBORO 25 % EX PACK: PACK | CUTANEOUS | Status: DC

## 2013-04-01 MED ORDER — DOXYCYCLINE HYCLATE 100 MG PO CAPS: 100.0000 mg | ORAL_CAPSULE | Freq: Two times a day (BID) | ORAL | Status: DC

## 2013-04-01 NOTE — ED Notes (Signed)
Dale Lowe c/o rash x 2 1/2 weeks . ?Poison ivy.

## 2013-04-01 NOTE — ED Provider Notes (Signed)
CSN: [id number]     Arrival date & time 04/01/13  1445 History     First MD Initiated Contact with Patient 04/01/13 1511     Chief Complaint  Patient presents with  . Rash     HPI Comments: Patient states that he was pulling weeds about 2.5 weeks ago resulting in a rash on his hands.  Over the past several days the skin has become warm, slightly swollen, and increasingly red.  The areas have has slight clear drainage.  No fevers, chills, and sweats   Patient is a 67 y.o. male presenting with rash. The history is provided by the patient.  Rash Pain location: hands. Pain quality comment:  Itching Pain severity:  Mild Onset quality:  Gradual Duration:  3 weeks Timing:  Constant Progression:  Worsening Chronicity:  New Context comment:  Pulling weeks Relieved by:  Nothing Exacerbated by: heat. Associated symptoms: no chills, no fatigue and no fever     Past Medical History  Diagnosis Date  . Diabetes mellitus   . Seizures   . Hyperlipidemia   . Hypertension   . Acute angina   . Cancer     skin CA   Past Surgical History  Procedure Laterality Date  . Cardiac catheterization    . Cholecystectomy     Family History  Problem Relation Age of Onset  . Diabetes Father   . Heart attack Father   . Diabetes Sister   . Cancer Sister   . Cancer Sister    History  Substance Use Topics  . Smoking status: Former Smoker    Quit date: 09/09/1995  . Smokeless tobacco: Never Used  . Alcohol Use: No    Review of Systems  Constitutional: Negative for fever, chills and fatigue.  Skin: Positive for rash.  All other systems reviewed and are negative.    Allergies  Hydrocodone  Home Medications   Current Outpatient Rx  Name  Route  Sig  Dispense  Refill  . Alum Sulfate-Ca Acetate (DOMEBORO) 25 % PACK      Dissolve one packet in water.  Apply as a wet compress 2 to 3 times daily for 15 to 30 minutes   12 each   0   . aspirin 81 MG tablet   Oral   Take 81 mg by mouth  daily.         Marland Kitchen atenolol (TENORMIN) 100 MG tablet   Oral   Take 100 mg by mouth daily.           Marland Kitchen atorvastatin (LIPITOR) 80 MG tablet   Oral   Take 80 mg by mouth daily.         . carbamazepine (TEGRETOL XR) 200 MG 12 hr tablet   Oral   Take 600 mg by mouth 2 (two) times daily.           . clonazePAM (KLONOPIN) 1 MG tablet   Oral   Take 1 mg by mouth 2 (two) times daily as needed.           . doxycycline (VIBRAMYCIN) 100 MG capsule   Oral   Take 1 capsule (100 mg total) by mouth 2 (two) times daily.   20 capsule   0   . fish oil-omega-3 fatty acids 1000 MG capsule   Oral   Take 2 g by mouth daily.         . flunisolide (NASALIDE) 0.025 % SOLN   Inhalation   Inhale 2 sprays  into the lungs 2 (two) times daily.           . hydrOXYzine (ATARAX) 50 MG tablet   Oral   Take 50 mg by mouth at bedtime as needed.           . levETIRAcetam (KEPPRA) 500 MG tablet   Oral   Take 1,000 mg by mouth every 12 (twelve) hours.           . nitroGLYCERIN (NITRODUR - DOSED IN MG/24 HR) 0.4 mg/hr   Transdermal   Place 1 patch onto the skin daily.           Marland Kitchen omeprazole (PRILOSEC) 20 MG capsule   Oral   Take 20 mg by mouth daily.           . Skin Protectants, Misc. (EUCERIN) cream   Topical   Apply topically 2 (two) times daily as needed.           . venlafaxine (EFFEXOR) 75 MG tablet   Oral   Take 225 mg by mouth daily.            BP 113/63  Pulse 70  Temp(Src) 98.3 F (36.8 C) (Oral)  Resp 14  Wt 193 lb (87.544 kg)  BMI 30.22 kg/m2  SpO2 96% Physical Exam  Constitutional: He is oriented to person, place, and time. He appears well-developed and well-nourished. No distress.  Patient is obese (BMI 30.2)  HENT:  Head: Normocephalic and atraumatic.  Mouth/Throat: Oropharynx is clear and moist.  Eyes: Conjunctivae are normal. Pupils are equal, round, and reactive to light.  Cardiovascular: Normal heart sounds.   Pulmonary/Chest: Breath sounds  normal.  Lymphadenopathy:    He has no cervical adenopathy.  Neurological: He is alert and oriented to person, place, and time.  Skin: Skin is warm and dry. There is erythema.     There is mild erythema and warmth on the dorsa of hands without tenderness.  There are scattered minute excoriations     ED Course   Procedures  none  Labs Reviewed  WOUND CULTURE pending    1. Contact dermatitis and other eczema, due to unspecified cause   2. Cellulitis and abscess of upper arm and forearm     MDM  Wound culture pending.  Kenalog 40mg  IM.  Begin doxycycline for staph coverage. Rx for Domeboro soaks. May take Benadryl 25mg , two caps every 6 hours as needed for itching.  If symptoms become significantly worse during the night or over the weekend, proceed to the local emergency room. Followup with dermatologist if not improved 6 days.  Lattie Haw, MD 04/09/13 [id number]

## 2013-04-04 LAB — WOUND CULTURE: Gram Stain: NONE SEEN

## 2013-05-18 DIAGNOSIS — Z23 Encounter for immunization: Secondary | ICD-10-CM | POA: Diagnosis not present

## 2013-05-26 ENCOUNTER — Emergency Department (INDEPENDENT_AMBULATORY_CARE_PROVIDER_SITE_OTHER)
Admission: EM | Admit: 2013-05-26 | Discharge: 2013-05-26 | Disposition: A | Discharge status: Home / Self Care | Payer: Medicare Other | Source: Home / Self Care | Attending: Family Medicine | Admitting: Family Medicine

## 2013-05-26 ENCOUNTER — Encounter: Payer: Self-pay | Admitting: *Deleted

## 2013-05-26 ENCOUNTER — Emergency Department (INDEPENDENT_AMBULATORY_CARE_PROVIDER_SITE_OTHER): Payer: Medicare Other

## 2013-05-26 DIAGNOSIS — R079 Chest pain, unspecified: Secondary | ICD-10-CM | POA: Diagnosis not present

## 2013-05-26 DIAGNOSIS — S20211A Contusion of right front wall of thorax, initial encounter: Secondary | ICD-10-CM

## 2013-05-26 DIAGNOSIS — S298XXA Other specified injuries of thorax, initial encounter: Secondary | ICD-10-CM | POA: Diagnosis not present

## 2013-05-26 DIAGNOSIS — S20219A Contusion of unspecified front wall of thorax, initial encounter: Secondary | ICD-10-CM

## 2013-05-26 MED ORDER — HYDROCODONE-ACETAMINOPHEN 5-325 MG PO TABS: ORAL_TABLET | ORAL | Status: DC

## 2013-05-26 NOTE — ED Notes (Signed)
Fell in shower this AM trying to sit on a shower chair, missed and slid off. Pain is on left side, thoracic.

## 2013-05-26 NOTE — ED Provider Notes (Signed)
CSN: [id number]     Arrival date & time 05/26/13  1634 History   First MD Initiated Contact with Patient 05/26/13 1701     Chief Complaint  Patient presents with  . Back Pain      HPI Comments: While attempting to sit on his shower seat this morning, patient slipped and slid off, striking his left posterior chest.  He has had pain with movement and deep inspiration but no shortness of breath.  Patient is a 67 y.o. male presenting with chest pain. The history is provided by the patient and the spouse.  Chest Pain Chest pain location: left posterior chest. Pain quality: stabbing   Pain radiates to:  Does not radiate Pain severity:  Moderate Onset quality:  Sudden Duration:  8 hours Timing:  Constant Progression:  Unchanged Chronicity:  New Context comment:  Slipped in shower Relieved by:  Nothing Worsened by:  Movement, deep breathing and certain positions Ineffective treatments:  None tried Associated symptoms: no abdominal pain, no back pain, no cough, no dizziness, no nausea, no palpitations, no shortness of breath and no syncope     Past Medical History  Diagnosis Date  . Diabetes mellitus   . Seizures   . Hyperlipidemia   . Hypertension   . Acute angina   . Cancer     skin CA   Past Surgical History  Procedure Laterality Date  . Cardiac catheterization    . Cholecystectomy     Family History  Problem Relation Age of Onset  . Diabetes Father   . Heart attack Father   . Diabetes Sister   . Cancer Sister   . Cancer Sister    History  Substance Use Topics  . Smoking status: Former Smoker    Quit date: 09/09/1995  . Smokeless tobacco: Never Used  . Alcohol Use: No    Review of Systems  Respiratory: Negative for cough and shortness of breath.   Cardiovascular: Positive for chest pain. Negative for palpitations and syncope.  Gastrointestinal: Negative for nausea and abdominal pain.  Musculoskeletal: Negative for back pain.  Neurological: Negative for  dizziness.  All other systems reviewed and are negative.    Allergies  Review of patient's allergies indicates no active allergies.  Home Medications   Current Outpatient Rx  Name  Route  Sig  Dispense  Refill  . Alum Sulfate-Ca Acetate (DOMEBORO) 25 % PACK      Dissolve one packet in water.  Apply as a wet compress 2 to 3 times daily for 15 to 30 minutes   12 each   0   . aspirin 81 MG tablet   Oral   Take 81 mg by mouth daily.         Marland Kitchen atenolol (TENORMIN) 100 MG tablet   Oral   Take 100 mg by mouth daily.           Marland Kitchen atorvastatin (LIPITOR) 80 MG tablet   Oral   Take 80 mg by mouth daily.         . carbamazepine (TEGRETOL XR) 200 MG 12 hr tablet   Oral   Take 600 mg by mouth 2 (two) times daily.           . clonazePAM (KLONOPIN) 1 MG tablet   Oral   Take 1 mg by mouth 2 (two) times daily as needed.           . doxycycline (VIBRAMYCIN) 100 MG capsule   Oral   Take  1 capsule (100 mg total) by mouth 2 (two) times daily.   20 capsule   0   . fish oil-omega-3 fatty acids 1000 MG capsule   Oral   Take 2 g by mouth daily.         . flunisolide (NASALIDE) 0.025 % SOLN   Inhalation   Inhale 2 sprays into the lungs 2 (two) times daily.           Marland Kitchen HYDROcodone-acetaminophen (NORCO/VICODIN) 5-325 MG per tablet      Take one by mouth at bedtime as needed for pain   10 tablet   0   . hydrOXYzine (ATARAX) 50 MG tablet   Oral   Take 50 mg by mouth at bedtime as needed.           . levETIRAcetam (KEPPRA) 500 MG tablet   Oral   Take 1,000 mg by mouth every 12 (twelve) hours.           . nitroGLYCERIN (NITRODUR - DOSED IN MG/24 HR) 0.4 mg/hr   Transdermal   Place 1 patch onto the skin daily.           Marland Kitchen omeprazole (PRILOSEC) 20 MG capsule   Oral   Take 20 mg by mouth daily.           . Skin Protectants, Misc. (EUCERIN) cream   Topical   Apply topically 2 (two) times daily as needed.           . venlafaxine (EFFEXOR) 75 MG tablet    Oral   Take 225 mg by mouth daily.            BP 106/66  Pulse 80  Resp 16  Wt 184 lb (83.462 kg)  BMI 28.81 kg/m2  SpO2 96% Physical Exam  Nursing note and vitals reviewed. Constitutional: He is oriented to person, place, and time. He appears well-developed and well-nourished. No distress.  HENT:  Head: Atraumatic.  Eyes: Conjunctivae are normal. Pupils are equal, round, and reactive to light.  Neck: Normal range of motion.  Cardiovascular: Normal heart sounds.   Pulmonary/Chest: Breath sounds normal.    There is tenderness to palpation left posterior inferior ribs as noted on diagram.  There is minimal erythema at site, but no swelling, ecchymosis or crepitance.  Abdominal: There is no tenderness.  Neurological: He is alert and oriented to person, place, and time.  Skin: Skin is warm and dry. No erythema.    ED Course  Procedures  none    Imaging Review Dg Ribs Unilateral W/chest Left  05/26/2013   CLINICAL DATA:  Pain in the and left lower ribs after a fall.  EXAM: LEFT RIBS AND CHEST - 3+ VIEW  COMPARISON:  CHEST x-ray 05/06/2011.  FINDINGS: Lung volumes are normal. No consolidative airspace disease. No pleural effusions. No pneumothorax. No pulmonary nodule or mass noted. Pulmonary vasculature and the cardiomediastinal silhouette are within normal limits.  Dedicated views of the left tube ribs demonstrate no definite acute displaced rib fractures.  IMPRESSION: 1. No acute displaced left-sided rib fractures. 2. No radiographic evidence of acute cardiopulmonary disease.   Electronically Signed   By: Trudie Reed M.D.   On: 05/26/2013 18:03    MDM   1. Contusion of ribs, right, initial encounter    Rib belt applied.  Lortab for pain at bedtime. Apply ice pack three or four times daily for 2 to 3 days.  Wear rib belt daytime. Return for worsening symptoms; discussed  red flags.       Lattie Haw, MD 05/26/13 [phone]

## 2013-11-09 ENCOUNTER — Emergency Department (INDEPENDENT_AMBULATORY_CARE_PROVIDER_SITE_OTHER)
Admission: EM | Admit: 2013-11-09 | Discharge: 2013-11-09 | Disposition: A | Discharge status: Home / Self Care | Payer: Medicare Other | Source: Home / Self Care | Attending: Family Medicine | Admitting: Family Medicine

## 2013-11-09 ENCOUNTER — Encounter: Payer: Self-pay | Admitting: Emergency Medicine

## 2013-11-09 ENCOUNTER — Emergency Department (INDEPENDENT_AMBULATORY_CARE_PROVIDER_SITE_OTHER): Payer: Medicare Other

## 2013-11-09 DIAGNOSIS — Q828 Other specified congenital malformations of skin: Secondary | ICD-10-CM | POA: Diagnosis not present

## 2013-11-09 DIAGNOSIS — R55 Syncope and collapse: Secondary | ICD-10-CM | POA: Diagnosis not present

## 2013-11-09 DIAGNOSIS — L858 Other specified epidermal thickening: Secondary | ICD-10-CM

## 2013-11-09 DIAGNOSIS — S0093XA Contusion of unspecified part of head, initial encounter: Secondary | ICD-10-CM

## 2013-11-09 DIAGNOSIS — S0083XA Contusion of other part of head, initial encounter: Secondary | ICD-10-CM

## 2013-11-09 DIAGNOSIS — S0003XA Contusion of scalp, initial encounter: Secondary | ICD-10-CM

## 2013-11-09 DIAGNOSIS — S1093XA Contusion of unspecified part of neck, initial encounter: Secondary | ICD-10-CM

## 2013-11-09 DIAGNOSIS — R404 Transient alteration of awareness: Secondary | ICD-10-CM | POA: Diagnosis not present

## 2013-11-09 HISTORY — DX: Anxiety disorder, unspecified: F41.9

## 2013-11-09 HISTORY — DX: Post-traumatic stress disorder, unspecified: F43.10

## 2013-11-09 LAB — POCT FASTING CBG KUC MANUAL ENTRY: POCT Glucose (KUC): 166 mg/dL — AB (ref 70–99)

## 2013-11-09 LAB — POCT CBC W AUTO DIFF (K'VILLE URGENT CARE)

## 2013-11-09 LAB — COMPLETE METABOLIC PANEL WITH GFR
ALBUMIN: 4.5 g/dL (ref 3.5–5.2)
ALT: 50 U/L (ref 0–53)
AST: 46 U/L — ABNORMAL HIGH (ref 0–37)
Alkaline Phosphatase: 90 U/L (ref 39–117)
BILIRUBIN TOTAL: 0.3 mg/dL (ref 0.2–1.2)
BUN: 21 mg/dL (ref 6–23)
CO2: 27 meq/L (ref 19–32)
Calcium: 9.4 mg/dL (ref 8.4–10.5)
Chloride: 100 mEq/L (ref 96–112)
Creat: 0.88 mg/dL (ref 0.50–1.35)
GFR, EST NON AFRICAN AMERICAN: 89 mL/min
GLUCOSE: 156 mg/dL — AB (ref 70–99)
POTASSIUM: 4.7 meq/L (ref 3.5–5.3)
SODIUM: 136 meq/L (ref 135–145)
TOTAL PROTEIN: 7.6 g/dL (ref 6.0–8.3)

## 2013-11-09 MED ORDER — PREDNISONE 20 MG PO TABS: 20.0000 mg | ORAL_TABLET | Freq: Two times a day (BID) | ORAL | Status: DC

## 2013-11-09 NOTE — Discharge Instructions (Signed)
Apply ice pack several times daily until swelling scalp resolves.   May use Tylenol for pain. If symptoms become significantly worse during the night or over the weekend, proceed to the local emergency room.   For skin condition:   Minimize baths/showers.  Use a mild bath soap containing oil such as unscented Dove.  Apply a moisturizing cream or lotion immediately after bathing while still wet, then towel dry.    Head Injury, Adult You have received a head injury. It does not appear serious at this time. Headaches and vomiting are common following head injury. It should be easy to awaken from sleeping. Sometimes it is necessary for you to stay in the emergency department for a while for observation. Sometimes admission to the hospital may be needed. After injuries such as yours, most problems occur within the first 24 hours, but side effects may occur up to 7 10 days after the injury. It is important for you to carefully monitor your condition and contact your health care provider or seek immediate medical care if there is a change in your condition. WHAT ARE THE TYPES OF HEAD INJURIES? Head injuries can be as minor as a bump. Some head injuries can be more severe. More severe head injuries include:  A jarring injury to the brain (concussion).  A bruise of the brain (contusion). This mean there is bleeding in the brain that can cause swelling.  A cracked skull (skull fracture).  Bleeding in the brain that collects, clots, and forms a bump (hematoma). WHAT CAUSES A HEAD INJURY? A serious head injury is most likely to happen to someone who is in a car wreck and is not wearing a seat belt. Other causes of major head injuries include bicycle or motorcycle accidents, sports injuries, and falls. HOW ARE HEAD INJURIES DIAGNOSED? A complete history of the event leading to the injury and your current symptoms will be helpful in diagnosing head injuries. Many times, pictures of the brain, such as CT or  MRI are needed to see the extent of the injury. Often, an overnight hospital stay is necessary for observation.  WHEN SHOULD I SEEK IMMEDIATE MEDICAL CARE?  You should get help right away if:  You have confusion or drowsiness.  You feel sick to your stomach (nauseous) or have continued, forceful vomiting.  You have dizziness or unsteadiness that is getting worse.  You have severe, continued headaches not relieved by medicine. Only take over-the-counter or prescription medicines for pain, fever, or discomfort as directed by your health care provider.  You do not have normal function of the arms or legs or are unable to walk.  You notice changes in the black spots in the center of the colored part of your eye (pupil).  You have a clear or bloody fluid coming from your nose or ears.  You have a loss of vision. During the next 24 hours after the injury, you must stay with someone who can watch you for the warning signs. This person should contact local emergency services (911 in the [place] if you have seizures, you become unconscious, or you are unable to wake up. HOW CAN I PREVENT A HEAD INJURY IN THE FUTURE? The most important factor for preventing major head injuries is avoiding motor vehicle accidents. To minimize the potential for damage to your head, it is crucial to wear seat belts while riding in motor vehicles. Wearing helmets while bike riding and playing collision sports (like football) is also helpful. Also, avoiding dangerous  activities around the house will further help reduce your risk of head injury.  WHEN CAN I RETURN TO NORMAL ACTIVITIES AND ATHLETICS? You should be reevaluated by your health care provider before returning to these activities. If you have any of the following symptoms, you should not return to activities or contact sports until 1 week after the symptoms have stopped:  Persistent headache.  Dizziness or vertigo.  Poor attention and  concentration.  Confusion.  Memory problems.  Nausea or vomiting.  Fatigue or tire easily.  Irritability.  Intolerant of bright lights or loud noises.  Anxiety or depression.  Disturbed sleep. MAKE SURE YOU:   Understand these instructions.  Will watch your condition.  Will get help right away if you are not doing well or get worse. Document Released: 08/25/2005 Document Revised: 06/15/2013 Document Reviewed: 05/02/2013 [facility] Patient Information 2014 [place].

## 2013-11-09 NOTE — ED Notes (Addendum)
Pt and his wife report at the vets office at approximately 0745 this morning he had a fall with a 1 minute episode of LOC. She denies that he had a seizure. Denies nausea or visual changes. He reports taking a Melatonin at 0400. He reports that he did not take his AM meds today. pts wife reports that EMS was called and they checked his BS 195, after eating cookies this morning. 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

## 2013-11-09 NOTE — ED Provider Notes (Signed)
CSN: [id number]     Arrival date & time 11/09/13  0848 History   None    Chief Complaint  Patient presents with  . Loss of Consciousness  . Abrasion  . Head Injury      HPI Comments: Patient and wife report that they took their dog to the vet this morning, prior to patient eating breakfast and taking his morning meds.  At 7:45 while in the vet's waiting room, he suddenly lost consciousness and fell to the floor striking his occipital area on the floor.  He immediately awakened. He has a history of a seizure disorder, on Tegretol, but reports that he has not had a seizure in three years.  His wife reports that he did not have typical seizure activity during his loss of consciousness today.  He feels well now except for mild posterior headache.  EMS was summoned and evaluated patient.  Finger stick glucose at that time was about 161.  They recommended that he follow-up with ED or urgent care. 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  He recovered quite well from his stroke with only minimal left-sided weakness. Patient reports that he takes aspirin 81mg  daily. He also complains of a several week history of scattered pruritic rash on his arms and legs.    Patient is a 68 y.o. male presenting with syncope. The history is provided by the patient and the spouse.  Loss of Consciousness Episode history:  Single Duration:  1 minute Progression:  Resolved Chronicity:  New Context: normal activity   Witnessed: yes   Associated symptoms: headaches   Associated symptoms: no chest pain, no confusion, no diaphoresis, no difficulty breathing, no dizziness, no fever, no focal sensory loss, no focal weakness, no malaise/fatigue, no nausea, no palpitations, no recent fall, no recent injury, no recent surgery, no seizures, no shortness of breath, no visual change, no vomiting and no weakness   Risk factors: seizures      Past Medical History  Diagnosis Date  . Diabetes mellitus   . Seizures   . Hyperlipidemia   . Hypertension   . Acute angina   . Cancer     skin CA  . PTSD (post-traumatic stress disorder)   . Anxiety    Past Surgical History  Procedure Laterality Date  . Cardiac catheterization    . Cholecystectomy     Family History  Problem Relation Age of Onset  . Diabetes Father   . Heart attack Father   . Diabetes Sister   . Cancer Sister   . Cancer Sister    History  Substance Use Topics  . Smoking status: Former Smoker    Quit date: 09/09/1995  . Smokeless tobacco: Never Used  . Alcohol Use: No    Review of Systems  Constitutional: Negative for fever, malaise/fatigue and diaphoresis.  Respiratory: Negative for shortness of breath.   Cardiovascular: Positive for syncope. Negative for chest pain and palpitations.  Gastrointestinal: Negative for nausea and vomiting.  Skin: Positive for rash.  Neurological: Positive for headaches. Negative for dizziness, focal weakness, seizures and weakness.  Psychiatric/Behavioral: Negative for confusion.  All other systems reviewed and are negative.    Allergies  Review of patient's allergies indicates no known allergies.  Home Medications   Current Outpatient Rx  Name  Route  Sig  Dispense  Refill  . Alum Sulfate-Ca Acetate (DOMEBORO) 25 % PACK  Dissolve one packet in water.  Apply as a wet compress 2 to 3 times daily for 15 to 30 minutes   12 each   0   . aspirin 81 MG tablet   Oral   Take 81 mg by mouth daily.         Marland Kitchen atenolol (TENORMIN) 100 MG tablet   Oral   Take 100 mg by mouth daily.           Marland Kitchen atorvastatin (LIPITOR) 80 MG tablet   Oral   Take 80 mg by mouth daily.         . carbamazepine (TEGRETOL XR) 200 MG 12 hr tablet   Oral   Take 600 mg by mouth 2 (two) times daily.           . clonazePAM (KLONOPIN) 1 MG tablet   Oral   Take 1 mg by mouth 2 (two) times daily as needed.           .  fish oil-omega-3 fatty acids 1000 MG capsule   Oral   Take 2 g by mouth daily.         . flunisolide (NASALIDE) 0.025 % SOLN   Inhalation   Inhale 2 sprays into the lungs 2 (two) times daily.           Marland Kitchen HYDROcodone-acetaminophen (NORCO/VICODIN) 5-325 MG per tablet      Take one by mouth at bedtime as needed for pain   10 tablet   0   . hydrOXYzine (ATARAX) 50 MG tablet   Oral   Take 50 mg by mouth at bedtime as needed.           . levETIRAcetam (KEPPRA) 500 MG tablet   Oral   Take 1,000 mg by mouth every 12 (twelve) hours.           . nitroGLYCERIN (NITRODUR - DOSED IN MG/24 HR) 0.4 mg/hr   Transdermal   Place 1 patch onto the skin daily.           Marland Kitchen omeprazole (PRILOSEC) 20 MG capsule   Oral   Take 20 mg by mouth daily.           . predniSONE (DELTASONE) 20 MG tablet   Oral   Take 1 tablet (20 mg total) by mouth 2 (two) times daily. Take with food.   10 tablet   0   . Skin Protectants, Misc. (EUCERIN) cream   Topical   Apply topically 2 (two) times daily as needed.           . venlafaxine (EFFEXOR) 75 MG tablet   Oral   Take 225 mg by mouth daily.            BP 105/55  Pulse 65  Temp(Src) 98.4 F (36.9 C) (Oral)  Resp 18  Ht 5\' 6"  (1.676 m)  Wt 185 lb (83.915 kg)  BMI 29.87 kg/m2  SpO2 99% Physical Exam  Nursing note and vitals reviewed. Constitutional: He is oriented to person, place, and time. He appears well-developed and well-nourished. No distress.  HENT:  Head: Normocephalic.    Right Ear: External ear normal.  Left Ear: External ear normal.  Nose: Nose normal.  Mouth/Throat: Oropharynx is clear and moist.  Left occipital area has 5cm dia mild hematoma with minimal tenderness and no evidence depressed skull fracture.  No laceration.  Eyes: Conjunctivae and EOM are normal. Pupils are equal, round, and reactive to light.  Fundi benign  Neck: Normal range of motion.  Cardiovascular: Normal rate, regular rhythm and normal heart  sounds.   No murmur heard. Pulmonary/Chest: Effort normal and breath sounds normal. No respiratory distress. He has no wheezes. He has no rales.  Abdominal: Soft. Bowel sounds are normal. There is no tenderness.  Musculoskeletal: He exhibits no edema.  Neurological: He is alert and oriented to person, place, and time. He displays normal reflexes. No cranial nerve deficit. He exhibits normal muscle tone. Coordination normal.  Skin: Skin is warm and dry. Rash noted.  Arms and legs reveal "sandpaper" texture and collars of macular erythema around follicles.  Psychiatric: He has a normal mood and affect.    ED Course  Procedures  none    Labs Reviewed  COMPLETE METABOLIC PANEL WITH GFR - Abnormal; Notable for the following:    Glucose, Bld 156 (*)    AST 46 (*)    All other components within normal limits   Narrative:    Performed at:  [address], [address]                [place], [place] [id number]  POCT FASTING CBG [facility] MANUAL ENTRY - Abnormal; Notable for the following:    POCT Glucose ([facility]) 166 (*)    All other components within normal limits  POCT CBC W AUTO DIFF ([facility]):  WBC 9.8; LY 26.1; MO 6.4; GR 67.5; Hgb 14.7; Platelets 141    EKG: Rate:  64 PR:   0.202 QT:  0.408 QRS:  0.104 QRS axis:  21 degrees Interpretation:  Sinus rhythm; no acute changes   Imaging Review Ct Head Wo Contrast  11/09/2013   CLINICAL DATA:  Loss of consciousness post trauma  EXAM: CT HEAD WITHOUT CONTRAST  TECHNIQUE: Contiguous axial images were obtained from the base of the skull through the vertex without intravenous contrast.  COMPARISON:  None.  FINDINGS: There is mild diffuse atrophy. There is no mass, hemorrhage, extra-axial fluid collection, or midline shift. There is patchy small vessel disease in the centra semiovale bilaterally. Elsewhere gray-white compartments appear normal. There is no demonstrable acute infarct. Bony calvarium appears  intact. Mastoid air cells are clear.  IMPRESSION: Mild atrophy with patchy periventricular small vessel disease. Study otherwise unremarkable.   Electronically Signed   By: Lowella Grip M.D.   On: 11/09/2013 10:10     MD   1. Syncope and collapse ?etiology.  ?hypoglycemic episode (note that patient had not eaten prior to syncopal episode).  Note normal neurologic exam   2. Contusion of head.  No evidence intracranial hemorrhage   3. Keratosis pilaris    Discussed head injury precautions. Apply ice pack several times daily until swelling scalp resolves.   May use Tylenol for pain. If symptoms become significantly worse during the night or over the weekend, proceed to the local emergency room.   For skin condition:  Begin prednisone burst.   Minimize baths/showers.  Use a mild bath soap containing oil such as unscented Dove.  Apply a moisturizing cream or lotion containing urea, alpha-hydroxy acid, salicylic acid, or lactic acid immediately after bathing while still wet, then towel dry.  Given a [employer].  Followup with PCP    Kandra Nicolas, MD 11/10/13 2135

## 2013-11-12 ENCOUNTER — Telehealth: Payer: Self-pay | Admitting: Emergency Medicine

## 2014-01-19 DIAGNOSIS — E11359 Type 2 diabetes mellitus with proliferative diabetic retinopathy without macular edema: Secondary | ICD-10-CM | POA: Diagnosis not present

## 2014-01-19 DIAGNOSIS — H35369 Drusen (degenerative) of macula, unspecified eye: Secondary | ICD-10-CM | POA: Diagnosis not present

## 2014-02-06 DIAGNOSIS — L570 Actinic keratosis: Secondary | ICD-10-CM | POA: Diagnosis not present

## 2014-02-06 DIAGNOSIS — L219 Seborrheic dermatitis, unspecified: Secondary | ICD-10-CM | POA: Diagnosis not present

## 2014-02-06 DIAGNOSIS — C44221 Squamous cell carcinoma of skin of unspecified ear and external auricular canal: Secondary | ICD-10-CM | POA: Diagnosis not present

## 2014-05-30 DIAGNOSIS — Z23 Encounter for immunization: Secondary | ICD-10-CM | POA: Diagnosis not present

## 2014-06-22 ENCOUNTER — Emergency Department (INDEPENDENT_AMBULATORY_CARE_PROVIDER_SITE_OTHER)
Admission: EM | Admit: 2014-06-22 | Discharge: 2014-06-22 | Disposition: A | Discharge status: Home / Self Care | Payer: Medicare Other | Source: Home / Self Care | Attending: Emergency Medicine | Admitting: Emergency Medicine

## 2014-06-22 ENCOUNTER — Encounter: Payer: Self-pay | Admitting: Emergency Medicine

## 2014-06-22 ENCOUNTER — Telehealth: Payer: Self-pay | Admitting: Emergency Medicine

## 2014-06-22 DIAGNOSIS — H6092 Unspecified otitis externa, left ear: Secondary | ICD-10-CM | POA: Diagnosis not present

## 2014-06-22 MED ORDER — CIPROFLOXACIN-HYDROCORTISONE 0.2-1 % OT SUSP: 3.0000 [drp] | Freq: Two times a day (BID) | OTIC | Status: DC

## 2014-06-22 NOTE — ED Provider Notes (Signed)
CSN: [id number]     Arrival date & time 06/22/14  1404 History   First MD Initiated Contact with Patient 06/22/14 1407     Chief Complaint  Patient presents with  . Otalgia   (Consider location/radiation/quality/duration/timing/severity/associated sxs/prior Treatment) HPI Accidentally scratched left internal ear one week ago, now with persistent pain left ear. No drainage. No bleeding. No fever or chills. States he had some brief vertigo but that's resolved. No new focal neurologic symptoms. He states his diabetes is controlled. Here with wife. Denies fever or chills or nausea or vomiting or chest pain or shortness of breath Past Medical History  Diagnosis Date  . Diabetes mellitus   . Seizures   . Hyperlipidemia   . Hypertension   . Acute angina   . Cancer     skin CA  . PTSD (post-traumatic stress disorder)   . Anxiety    Past Surgical History  Procedure Laterality Date  . Cardiac catheterization    . Cholecystectomy     Family History  Problem Relation Age of Onset  . Diabetes Father   . Heart attack Father   . Diabetes Sister   . Cancer Sister   . Cancer Sister    History  Substance Use Topics  . Smoking status: Former Smoker    Quit date: 09/09/1995  . Smokeless tobacco: Never Used  . Alcohol Use: No    Review of Systems  All other systems reviewed and are negative.   Allergies  Review of patient's allergies indicates no active allergies.  Home Medications   Prior to Admission medications   Medication Sig Start Date End Date Taking? Authorizing Provider  aspirin 81 MG tablet Take 81 mg by mouth daily.    Historical Provider, MD  atorvastatin (LIPITOR) 80 MG tablet Take 80 mg by mouth daily.    Historical Provider, MD  carbamazepine (TEGRETOL XR) 200 MG 12 hr tablet Take 600 mg by mouth 2 (two) times daily.      Historical Provider, MD  ciprofloxacin-hydrocortisone (CIPRO HC OTIC) otic suspension Place 3 drops into the left ear 2 (two) times daily.  For 7 days 06/22/14   Jacqulyn Cane, MD  clonazePAM (KLONOPIN) 1 MG tablet Take 1 mg by mouth 2 (two) times daily as needed.      Historical Provider, MD  fish oil-omega-3 fatty acids 1000 MG capsule Take 2 g by mouth daily.    Historical Provider, MD  flunisolide (NASALIDE) 0.025 % SOLN Inhale 2 sprays into the lungs 2 (two) times daily.      Historical Provider, MD  HYDROcodone-acetaminophen (NORCO/VICODIN) 5-325 MG per tablet Take one by mouth at bedtime as needed for pain 05/26/13   Kandra Nicolas, MD  hydrOXYzine (ATARAX) 50 MG tablet Take 50 mg by mouth at bedtime as needed.      Historical Provider, MD  levETIRAcetam (KEPPRA) 500 MG tablet Take 1,000 mg by mouth every 12 (twelve) hours.      Historical Provider, MD  nitroGLYCERIN (NITRODUR - DOSED IN MG/24 HR) 0.4 mg/hr Place 1 patch onto the skin daily.      Historical Provider, MD  omeprazole (PRILOSEC) 20 MG capsule Take 20 mg by mouth daily.      Historical Provider, MD  Skin Protectants, Misc. (EUCERIN) cream Apply topically 2 (two) times daily as needed.      Historical Provider, MD   BP 110/68  Pulse 82  Temp(Src) 98.4 F (36.9 C) (Oral)  Ht 5\' 6"  (1.676 m)  Wt 183 lb (83.008 kg)  BMI 29.55 kg/m2  SpO2 96% Physical Exam  Nursing note and vitals reviewed. Constitutional: He is oriented to person, place, and time. He appears well-developed and well-nourished. No distress.  HENT:  Head: Normocephalic and atraumatic.  Right Ear: External ear normal.  Mouth/Throat: Oropharynx is clear and moist.  Left ear: External ear without swelling or tenderness. Ear canal slightly inflamed and swollen, minimal debris/wax and a tiny scab midway into the canal. The canal is patent. TM normal  Eyes: Conjunctivae and EOM are normal. Pupils are equal, round, and reactive to light. No scleral icterus.  Neck: Normal range of motion.  Cardiovascular: Normal rate.   Pulmonary/Chest: Effort normal.  Abdominal: He exhibits no distension.   Musculoskeletal: Normal range of motion.  Neurological: He is alert and oriented to person, place, and time.  Skin: Skin is warm.  Psychiatric: He has a normal mood and affect.   He can ambulate on his own ED Course  Procedures (including critical care time) Labs Review Labs Reviewed - No data to display  Imaging Review No results found.   MDM   1. Otitis externa; acute, left    Treatment options discussed, as well as risks, benefits, alternatives. Patient and wife voiced understanding and agreement with the following plans: Cipro otic HC prescribed Other advice given Followup with ENT if no better one week, sooner when necessary Also advised to followup with his PCP for his other chronic problems. He states he has an appointment with Dr. Henrene Pastor, his PCP at the [facility] in [place] within the next 2 weeks. Precautions discussed. Red flags discussed.--Per emergency room if any red flag Questions invited and answered. Patient and wife voiced understanding and agreement.      Jacqulyn Cane, MD 06/22/14 [phone]

## 2014-06-22 NOTE — ED Notes (Signed)
Left ear pain x 1 week

## 2014-07-05 DIAGNOSIS — Z08 Encounter for follow-up examination after completed treatment for malignant neoplasm: Secondary | ICD-10-CM | POA: Diagnosis not present

## 2014-07-05 DIAGNOSIS — L239 Allergic contact dermatitis, unspecified cause: Secondary | ICD-10-CM | POA: Diagnosis not present

## 2014-07-05 DIAGNOSIS — Z85828 Personal history of other malignant neoplasm of skin: Secondary | ICD-10-CM | POA: Diagnosis not present

## 2015-01-22 DIAGNOSIS — R972 Elevated prostate specific antigen [PSA]: Secondary | ICD-10-CM | POA: Diagnosis not present

## 2015-05-01 DIAGNOSIS — Z23 Encounter for immunization: Secondary | ICD-10-CM | POA: Diagnosis not present

## 2015-05-17 DIAGNOSIS — H35363 Drusen (degenerative) of macula, bilateral: Secondary | ICD-10-CM | POA: Diagnosis not present

## 2015-05-17 DIAGNOSIS — H524 Presbyopia: Secondary | ICD-10-CM | POA: Diagnosis not present

## 2015-05-17 DIAGNOSIS — H2513 Age-related nuclear cataract, bilateral: Secondary | ICD-10-CM | POA: Diagnosis not present

## 2015-06-12 ENCOUNTER — Emergency Department (INDEPENDENT_AMBULATORY_CARE_PROVIDER_SITE_OTHER)
Admission: EM | Admit: 2015-06-12 | Discharge: 2015-06-12 | Disposition: A | Discharge status: Home / Self Care | Payer: Medicare Other | Source: Home / Self Care | Attending: Family Medicine | Admitting: Family Medicine

## 2015-06-12 ENCOUNTER — Emergency Department (INDEPENDENT_AMBULATORY_CARE_PROVIDER_SITE_OTHER): Payer: Medicare Other

## 2015-06-12 ENCOUNTER — Encounter: Payer: Self-pay | Admitting: *Deleted

## 2015-06-12 DIAGNOSIS — M545 Low back pain, unspecified: Secondary | ICD-10-CM

## 2015-06-12 DIAGNOSIS — M47816 Spondylosis without myelopathy or radiculopathy, lumbar region: Secondary | ICD-10-CM | POA: Diagnosis not present

## 2015-06-12 MED ORDER — PREDNISONE 20 MG PO TABS: 20.0000 mg | ORAL_TABLET | Freq: Two times a day (BID) | ORAL | Status: DC

## 2015-06-12 NOTE — ED Provider Notes (Signed)
CSN: [id number]     Arrival date & time 06/12/15  1314 History   First MD Initiated Contact with Patient 06/12/15 1344     Chief Complaint  Patient presents with  . Back Pain      HPI Comments: Patient reports that he began exercising earlier this year, and four months ago he developed lower back pain that has persisted.  The pain does not radiate.  He does not recall a specific injury.   He denies bowel or bladder dysfunction, and no saddle numbness.  His pain is worse when arising from a chair, walking, and twisting his upper body.  The pain is better when standing.  No fevers, chills, and sweats.  No weight loss. He states that he has been wearing a back brace for about one week which has been helpful.    Patient is a 69 y.o. male presenting with back pain. The history is provided by the patient.  Back Pain Location:  Lumbar spine Quality:  Aching Radiates to:  Does not radiate Pain severity:  Moderate Pain is:  Same all the time Onset quality:  Gradual Duration:  4 months Timing:  Constant Progression:  Unchanged Chronicity:  Chronic Context comment:  Exercising Relieved by:  Nothing Worsened by:  Movement Ineffective treatments:  OTC medications Associated symptoms: no abdominal pain, no bladder incontinence, no bowel incontinence, no chest pain, no dysuria, no fever, no headaches, no leg pain, no numbness, no paresthesias, no pelvic pain, no perianal numbness, no tingling, no weakness and no weight loss     Past Medical History  Diagnosis Date  . Diabetes mellitus   . Seizures ([place])   . Hyperlipidemia   . Hypertension   . Acute angina ([place])   . Cancer ([place])     skin CA  . PTSD (post-traumatic stress disorder)   . Anxiety    Past Surgical History  Procedure Laterality Date  . Cardiac catheterization    . Cholecystectomy     Family History  Problem Relation Age of Onset  . Diabetes Father   . Heart attack Father   . Diabetes Sister   . Cancer Sister   . Cancer  Sister    Social History  Substance Use Topics  . Smoking status: Former Smoker    Quit date: 09/09/1995  . Smokeless tobacco: Never Used  . Alcohol Use: No    Review of Systems  Constitutional: Negative for fever and weight loss.  Cardiovascular: Negative for chest pain.  Gastrointestinal: Negative for abdominal pain and bowel incontinence.  Genitourinary: Negative for bladder incontinence, dysuria and pelvic pain.  Musculoskeletal: Positive for back pain.  Neurological: Negative for tingling, weakness, numbness, headaches and paresthesias.  All other systems reviewed and are negative.   Allergies  Review of patient's allergies indicates no known allergies.  Home Medications   Prior to Admission medications   Medication Sig Start Date End Date Taking? Authorizing Provider  aspirin 81 MG tablet Take 81 mg by mouth daily.    Historical Provider, MD  atorvastatin (LIPITOR) 80 MG tablet Take 80 mg by mouth daily.    Historical Provider, MD  carbamazepine (TEGRETOL XR) 200 MG 12 hr tablet Take 600 mg by mouth 2 (two) times daily.      Historical Provider, MD  clonazePAM (KLONOPIN) 1 MG tablet Take 1 mg by mouth 2 (two) times daily as needed.      Historical Provider, MD  fish oil-omega-3 fatty acids 1000 MG capsule Take 2 g  by mouth daily.    Historical Provider, MD  flunisolide (NASALIDE) 0.025 % SOLN Inhale 2 sprays into the lungs 2 (two) times daily.      Historical Provider, MD  HYDROcodone-acetaminophen (NORCO/VICODIN) 5-325 MG per tablet Take one by mouth at bedtime as needed for pain 05/26/13   Kandra Nicolas, MD  hydrOXYzine (ATARAX) 50 MG tablet Take 50 mg by mouth at bedtime as needed.      Historical Provider, MD  levETIRAcetam (KEPPRA) 500 MG tablet Take 1,000 mg by mouth every 12 (twelve) hours.      Historical Provider, MD  nitroGLYCERIN (NITRODUR - DOSED IN MG/24 HR) 0.4 mg/hr Place 1 patch onto the skin daily.      Historical Provider, MD  omeprazole (PRILOSEC) 20 MG  capsule Take 20 mg by mouth daily.      Historical Provider, MD  predniSONE (DELTASONE) 20 MG tablet Take 1 tablet (20 mg total) by mouth 2 (two) times daily. Take with food. 06/12/15   Kandra Nicolas, MD  Skin Protectants, Misc. (EUCERIN) cream Apply topically 2 (two) times daily as needed.      Historical Provider, MD   Meds Ordered and Administered this Visit  Medications - No data to display  BP 154/77 mmHg  Pulse 69  Temp(Src) 98.3 F (36.8 C) (Oral)  Resp 16  Ht 5\' 7"  (1.702 m)  Wt 184 lb (83.462 kg)  BMI 28.81 kg/m2  SpO2 96% No data found.   Physical Exam  Constitutional: He is oriented to person, place, and time. He appears well-developed and well-nourished. No distress.  HENT:  Head: Normocephalic and atraumatic.  Right Ear: External ear normal.  Left Ear: External ear normal.  Nose: Nose normal.  Mouth/Throat: Oropharynx is clear and moist.  Eyes: Conjunctivae and EOM are normal. Pupils are equal, round, and reactive to light.  Neck: Normal range of motion. Neck supple.  Cardiovascular: Normal heart sounds.   Pulmonary/Chest: Breath sounds normal.  Abdominal: There is no tenderness.  Musculoskeletal: He exhibits no edema.       Lumbar back: He exhibits decreased range of motion. He exhibits no tenderness, no bony tenderness, no swelling, no edema, no deformity and no laceration.       Back:  Back:   Can heel/toe walk and squat without difficulty.  Decreased forward flexion.  No tenderness to palpation; patient's area of pain is in lumbo-sacral area as noted on diagram.    Straight leg raising test is negative.  Sitting knee extension test is negative.  Strength and sensation in the lower extremities is normal.  Patellar and achilles reflexes are normal.  FABER negative. Hips have full range of motion without tenderness to palpation over greater trochanters.   Neurological: He is alert and oriented to person, place, and time.  Skin: Skin is warm and dry.  Nursing  note and vitals reviewed.   ED Course  Procedures  None   Imaging Review Dg Lumbar Spine Complete  06/12/2015   CLINICAL DATA:  Lower back pain for 4 months.  No injury.  EXAM: LUMBAR SPINE - COMPLETE 4+ VIEW  COMPARISON:  None.  FINDINGS: Surgical clips in the right upper abdomen. Alignment of the lumbar spine is within normal limits. Bridging osteophytes throughout the lower thoracic spine and lumbar spine. The vertebral body heights are maintained. Disc spaces are relatively maintained and no significant spondylolisthesis. Degenerative facet disease in the lower lumbar spine.  IMPRESSION: Degenerative changes without acute bone abnormality.  Electronically Signed   By: Markus Daft M.D.   On: 06/12/2015 14:31      MDM   1. Bilateral low back pain without sciatica    Begin prednisone burst. Apply ice pack for 20 to 30 minutes, 3 to 4 times daily  Continue until pain decreases.  Begin back exercises. Followup with Dr. Aundria Mems or Dr. Lynne Leader ([facility]) in about one week.    Kandra Nicolas, MD 06/13/15 [phone]

## 2015-06-12 NOTE — Discharge Instructions (Signed)
Apply ice pack for 20 to 30 minutes, 3 to 4 times daily  Continue until pain decreases.    Back Exercises Back exercises help treat and prevent back injuries. The goal of back exercises is to increase the strength of your abdominal and back muscles and the flexibility of your back. These exercises should be started when you no longer have back pain. Back exercises include:  Pelvic Tilt. Lie on your back with your knees bent. Tilt your pelvis until the lower part of your back is against the floor. Hold this position 5 to 10 sec and repeat 5 to 10 times.  Knee to Chest. Pull first 1 knee up against your chest and hold for 20 to 30 seconds, repeat this with the other knee, and then both knees. This may be done with the other leg straight or bent, whichever feels better.  Sit-Ups or Curl-Ups. Bend your knees 90 degrees. Start with tilting your pelvis, and do a partial, slow sit-up, lifting your trunk only 30 to 45 degrees off the floor. Take at least 2 to 3 seconds for each sit-up. Do not do sit-ups with your knees out straight. If partial sit-ups are difficult, simply do the above but with only tightening your abdominal muscles and holding it as directed.  Hip-Lift. Lie on your back with your knees flexed 90 degrees. Push down with your feet and shoulders as you raise your hips a couple inches off the floor; hold for 10 seconds, repeat 5 to 10 times.  Back arches. Lie on your stomach, propping yourself up on bent elbows. Slowly press on your hands, causing an arch in your low back. Repeat 3 to 5 times. Any initial stiffness and discomfort should lessen with repetition over time.  Shoulder-Lifts. Lie face down with arms beside your body. Keep hips and torso pressed to floor as you slowly lift your head and shoulders off the floor. Do not overdo your exercises, especially in the beginning. Exercises may cause you some mild back discomfort which lasts for a few minutes; however, if the pain is more  severe, or lasts for more than 15 minutes, do not continue exercises until you see your caregiver. Improvement with exercise therapy for back problems is slow.  See your caregivers for assistance with developing a proper back exercise program. Document Released: 10/02/2004 Document Revised: 11/17/2011 Document Reviewed: 06/26/2011 [facility] Patient Information 2015 [place], [place]. This information is not intended to replace advice given to you by your health care provider. Make sure you discuss any questions you have with your health care provider.

## 2015-06-12 NOTE — ED Notes (Signed)
Pt c/o LBP x 3 mths. He is wearing a back brace that has helped some. OTC meds have given minimal relief. Denies injury.

## 2016-07-11 DIAGNOSIS — Z23 Encounter for immunization: Secondary | ICD-10-CM | POA: Diagnosis not present

## 2017-01-19 ENCOUNTER — Emergency Department (INDEPENDENT_AMBULATORY_CARE_PROVIDER_SITE_OTHER): Payer: Medicare Other

## 2017-01-19 ENCOUNTER — Encounter: Payer: Self-pay | Admitting: Emergency Medicine

## 2017-01-19 ENCOUNTER — Emergency Department (INDEPENDENT_AMBULATORY_CARE_PROVIDER_SITE_OTHER)
Admission: EM | Admit: 2017-01-19 | Discharge: 2017-01-19 | Disposition: A | Discharge status: Home / Self Care | Payer: Medicare Other | Source: Home / Self Care | Attending: Family Medicine | Admitting: Family Medicine

## 2017-01-19 DIAGNOSIS — M545 Low back pain, unspecified: Secondary | ICD-10-CM

## 2017-01-19 DIAGNOSIS — S3992XA Unspecified injury of lower back, initial encounter: Secondary | ICD-10-CM | POA: Diagnosis not present

## 2017-01-19 DIAGNOSIS — M25552 Pain in left hip: Secondary | ICD-10-CM

## 2017-01-19 DIAGNOSIS — W010XXA Fall on same level from slipping, tripping and stumbling without subsequent striking against object, initial encounter: Secondary | ICD-10-CM

## 2017-01-19 DIAGNOSIS — M51369 Other intervertebral disc degeneration, lumbar region without mention of lumbar back pain or lower extremity pain: Secondary | ICD-10-CM

## 2017-01-19 DIAGNOSIS — M5136 Other intervertebral disc degeneration, lumbar region: Secondary | ICD-10-CM | POA: Diagnosis not present

## 2017-01-19 DIAGNOSIS — M546 Pain in thoracic spine: Secondary | ICD-10-CM

## 2017-01-19 DIAGNOSIS — M2578 Osteophyte, vertebrae: Secondary | ICD-10-CM

## 2017-01-19 DIAGNOSIS — S299XXA Unspecified injury of thorax, initial encounter: Secondary | ICD-10-CM | POA: Diagnosis not present

## 2017-01-19 DIAGNOSIS — S79912A Unspecified injury of left hip, initial encounter: Secondary | ICD-10-CM | POA: Diagnosis not present

## 2017-01-19 MED ORDER — DICLOFENAC SODIUM 1 % TD GEL: 2.0000 g | Freq: Three times a day (TID) | TRANSDERMAL | 0 refills | Status: AC | PRN

## 2017-01-19 NOTE — ED Provider Notes (Addendum)
CSN: [id number]     Arrival date & time 01/19/17  1222 History   First MD Initiated Contact with Patient 01/19/17 1239     Chief Complaint  Patient presents with  . Fall   (Consider location/radiation/quality/duration/timing/severity/associated sxs/prior Treatment) HPI Dale Lowe is a 71 y.o. male presenting to UC with wife c/o back pain that is worse on Left side, started 1 week ago after slip and fall on his wet porch at home. He landed on his back. Denies hitting his head or LOC. Pain waxes and wanes, worse in the morning.  He notes he has tried ice, heat, a muscle rub, ibuprofen, and even a sleeping pill his wife gave to him last night.  Pt notes he slept in the same position flat on his back from 11PM to 3AM until he needed to get up to use the bathroom.  It took him several minutes to get to the bathroom due to pain and stiffness.  Pain worse in Left side of back but does not radiate down his legs. No hx of back surgeries.  He normally goes to the [place] for routine care.    Past Medical History:  Diagnosis Date  . Acute angina ([place])   . Anxiety   . Cancer ([place])    skin CA  . Diabetes mellitus   . Hyperlipidemia   . Hypertension   . PTSD (post-traumatic stress disorder)   . Seizures ([place])    Past Surgical History:  Procedure Laterality Date  . CARDIAC CATHETERIZATION    . CHOLECYSTECTOMY     Family History  Problem Relation Age of Onset  . Diabetes Father   . Heart attack Father   . Diabetes Sister   . Cancer Sister   . Cancer Sister    Social History  Substance Use Topics  . Smoking status: Former Smoker    Quit date: 09/09/1995  . Smokeless tobacco: Never Used  . Alcohol use No    Review of Systems  Musculoskeletal: Positive for arthralgias, back pain, gait problem and myalgias. Negative for joint swelling.  Skin: Negative for color change and wound.  Neurological: Negative for weakness and numbness.    Allergies  Patient has no known allergies.  Home  Medications   Prior to Admission medications   Medication Sig Start Date End Date Taking? Authorizing Provider  aspirin 81 MG tablet Take 81 mg by mouth daily.    [provider]  atorvastatin (LIPITOR) 80 MG tablet Take 80 mg by mouth daily.    [provider]  carbamazepine (TEGRETOL XR) 200 MG 12 hr tablet Take 600 mg by mouth 2 (two) times daily.      [provider]  clonazePAM (KLONOPIN) 1 MG tablet Take 1 mg by mouth 2 (two) times daily as needed.      [provider]  diclofenac sodium (VOLTAREN) 1 % GEL Apply 2-4 g topically 3 (three) times daily as needed. For 1 week 01/19/17   Noland Fordyce, PA-C  fish oil-omega-3 fatty acids 1000 MG capsule Take 2 g by mouth daily.    [provider]  flunisolide (NASALIDE) 0.025 % SOLN Inhale 2 sprays into the lungs 2 (two) times daily.      [provider]  hydrOXYzine (ATARAX) 50 MG tablet Take 50 mg by mouth at bedtime as needed.      [provider]  levETIRAcetam (KEPPRA) 500 MG tablet Take 1,000 mg by mouth every 12 (twelve) hours.  [provider]  nitroGLYCERIN (NITRODUR - DOSED IN MG/24 HR) 0.4 mg/hr Place 1 patch onto the skin daily.      [provider]  omeprazole (PRILOSEC) 20 MG capsule Take 20 mg by mouth daily.      [provider]  Skin Protectants, Misc. (EUCERIN) cream Apply topically 2 (two) times daily as needed.      [provider]   Meds Ordered and Administered this Visit  Medications - No data to display  BP (!) 166/82 (BP Location: Left Arm)   Pulse 71   Temp 98.6 F (37 C) (Oral)   Ht 5\' 6"  (1.676 m)   Wt 181 lb (82.1 kg)   SpO2 95%   BMI 29.21 kg/m  No data found.   Physical Exam  Constitutional: He is oriented to person, place, and time. He appears well-developed and well-nourished. No distress.  HENT:  Head: Normocephalic and atraumatic.  Eyes: EOM are normal.  Neck: Normal range of motion.   Cardiovascular: Normal rate.   Pulmonary/Chest: Effort normal.  Musculoskeletal: Normal range of motion. He exhibits tenderness. He exhibits no edema.  Tenderness to lower thoracic and lumbar spine and Left lower lumbar muscles.  Antalgic gait, uses cane for assistance.  Neurological: He is alert and oriented to person, place, and time.  Skin: Skin is warm and dry. He is not diaphoretic. No erythema.  Psychiatric: He has a normal mood and affect. His behavior is normal.  Nursing note and vitals reviewed.   Urgent Care Course     Procedures (including critical care time)  Labs Review Labs Reviewed - No data to display  Imaging Review Dg Thoracic Spine 2 View  Result Date: 01/19/2017 CLINICAL DATA:  Slip and fall 1 week ago with persistent pain, initial encounter EXAM: THORACIC SPINE 2 VIEWS COMPARISON:  None. FINDINGS: Vertebral body height is well maintained. The pedicles are within normal limits. Mild osteophytic changes are seen. No paraspinal mass lesion is noted. The visualize ribcage is unremarkable. IMPRESSION: Degenerative change without acute abnormality. Electronically Signed   By: Inez Catalina M.D.   On: 01/19/2017 13:29   Dg Lumbar Spine Complete  Result Date: 01/19/2017 CLINICAL DATA:  Patient slipped on reports 1 week ago landing on the buttocks and has had persistent low back and left posterior hip pain. EXAM: LUMBAR SPINE - COMPLETE 4+ VIEW COMPARISON:  Lumbar spine series of June 12, 2015 FINDINGS: The lumbar vertebral bodies are preserved in height. There are anterior bridging and near bridging osteophytes from L1 through L5. The disc space heights are well maintained. There is no spondylolisthesis. No pars defects are observed. There is degenerative hypertrophy of the L4-5 and L5-S1 facet joints. The pedicles and transverse processes are intact. The observed portions of the sacrum are normal. IMPRESSION: There is no acute bony abnormality of the lumbar spine nor  significant did disc space narrowing. There is degenerative facet joint hypertrophy and endplate osteophyte formation as described. Electronically Signed   By: David  Martinique M.D.   On: 01/19/2017 13:28   Dg Hip Unilat W Or Wo Pelvis 2-3 Views Left  Result Date: 01/19/2017 CLINICAL DATA:  Status post fall.  Left hip pain. EXAM: DG HIP (WITH OR WITHOUT PELVIS) 2-3V LEFT COMPARISON:  None. FINDINGS: There is no evidence of hip fracture or dislocation. No significant arthropathy of the left hip. Mild osteoarthritis of the right hip. IMPRESSION: No acute osseous injury of the left hip. Electronically Signed   By: Elbert Ewings  Patel   On: 01/19/2017 13:29     MDM   1. Fall from slip, trip, or stumble, initial encounter   2. Acute bilateral thoracic back pain   3. Acute midline low back pain without sciatica   4. Left hip pain   5. DDD (degenerative disc disease), lumbar    Plain films: Negative for acute injury.  DDD noted.  Reassured pt.  Encouraged to continue alternating cool and warm compresses. Offered Tramadol, pt's wife states he cannot take it as it interacts with his medications. Rx: diclofenac gel f/u with Sports Medicine for further evaluation and treatment.     Noland Fordyce, PA-C 01/19/17 1424   Back brace given to help reduce pain by restricting mobility of trunk. Discouraged from becoming dependent on the brace that it could weaken back muscles with long-term use.    Noland Fordyce, PA-C 02/06/17 [phone]

## 2017-01-19 NOTE — ED Triage Notes (Signed)
Fall one week ago, slipped and fell on the porch hurting his left hip and lower back, pain radiates up his left side. 5 to 10

## 2017-01-19 NOTE — Discharge Instructions (Signed)
°  You may use the back brace as needed for comfort.  Try to not become dependent on the brace as it can cause your back muscles to become week.  Please call to schedule a follow up appointment with Sports Medicine if not improving in 1 week as they may refer to physical therapy.

## 2017-05-29 IMAGING — DX DG HIP (WITH OR WITHOUT PELVIS) 2-3V*L*
3 series · 3 of 3 positions shown · non-contrast
Comparison: None.

CLINICAL DATA: Status post fall.  Left hip pain.

EXAM:
DG HIP (WITH OR WITHOUT PELVIS) 2-3V LEFT

[pelvis ap]
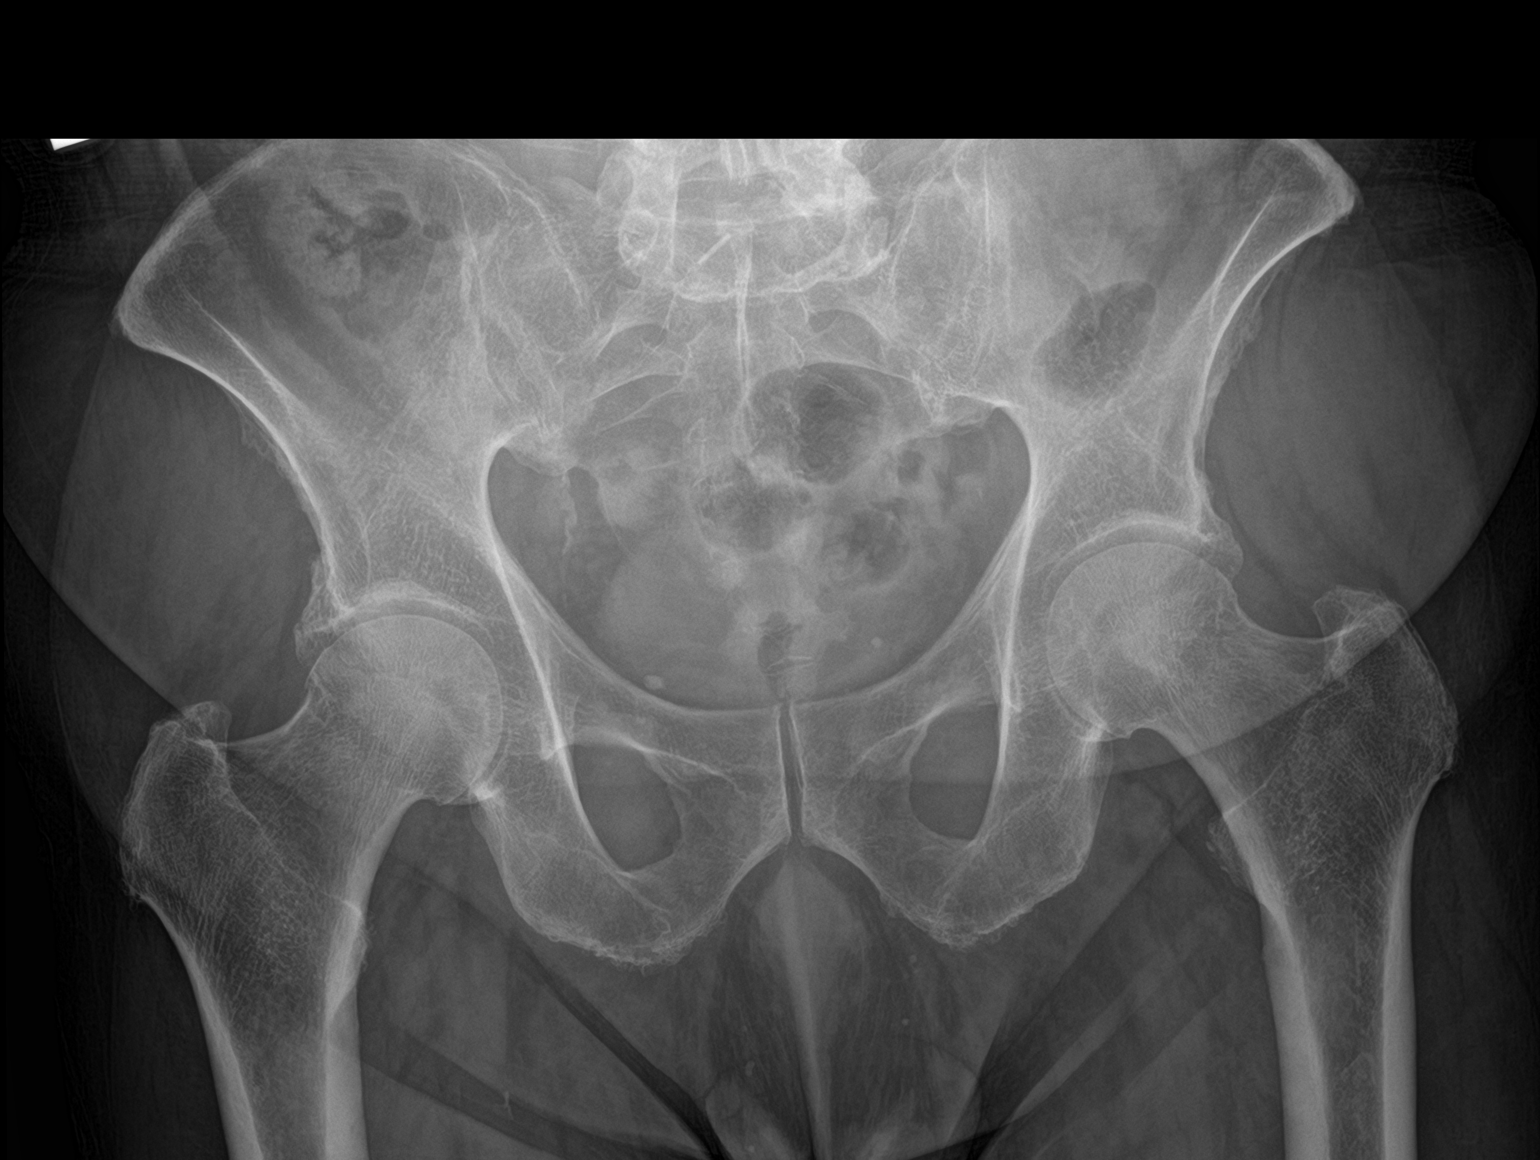

[hip ap]
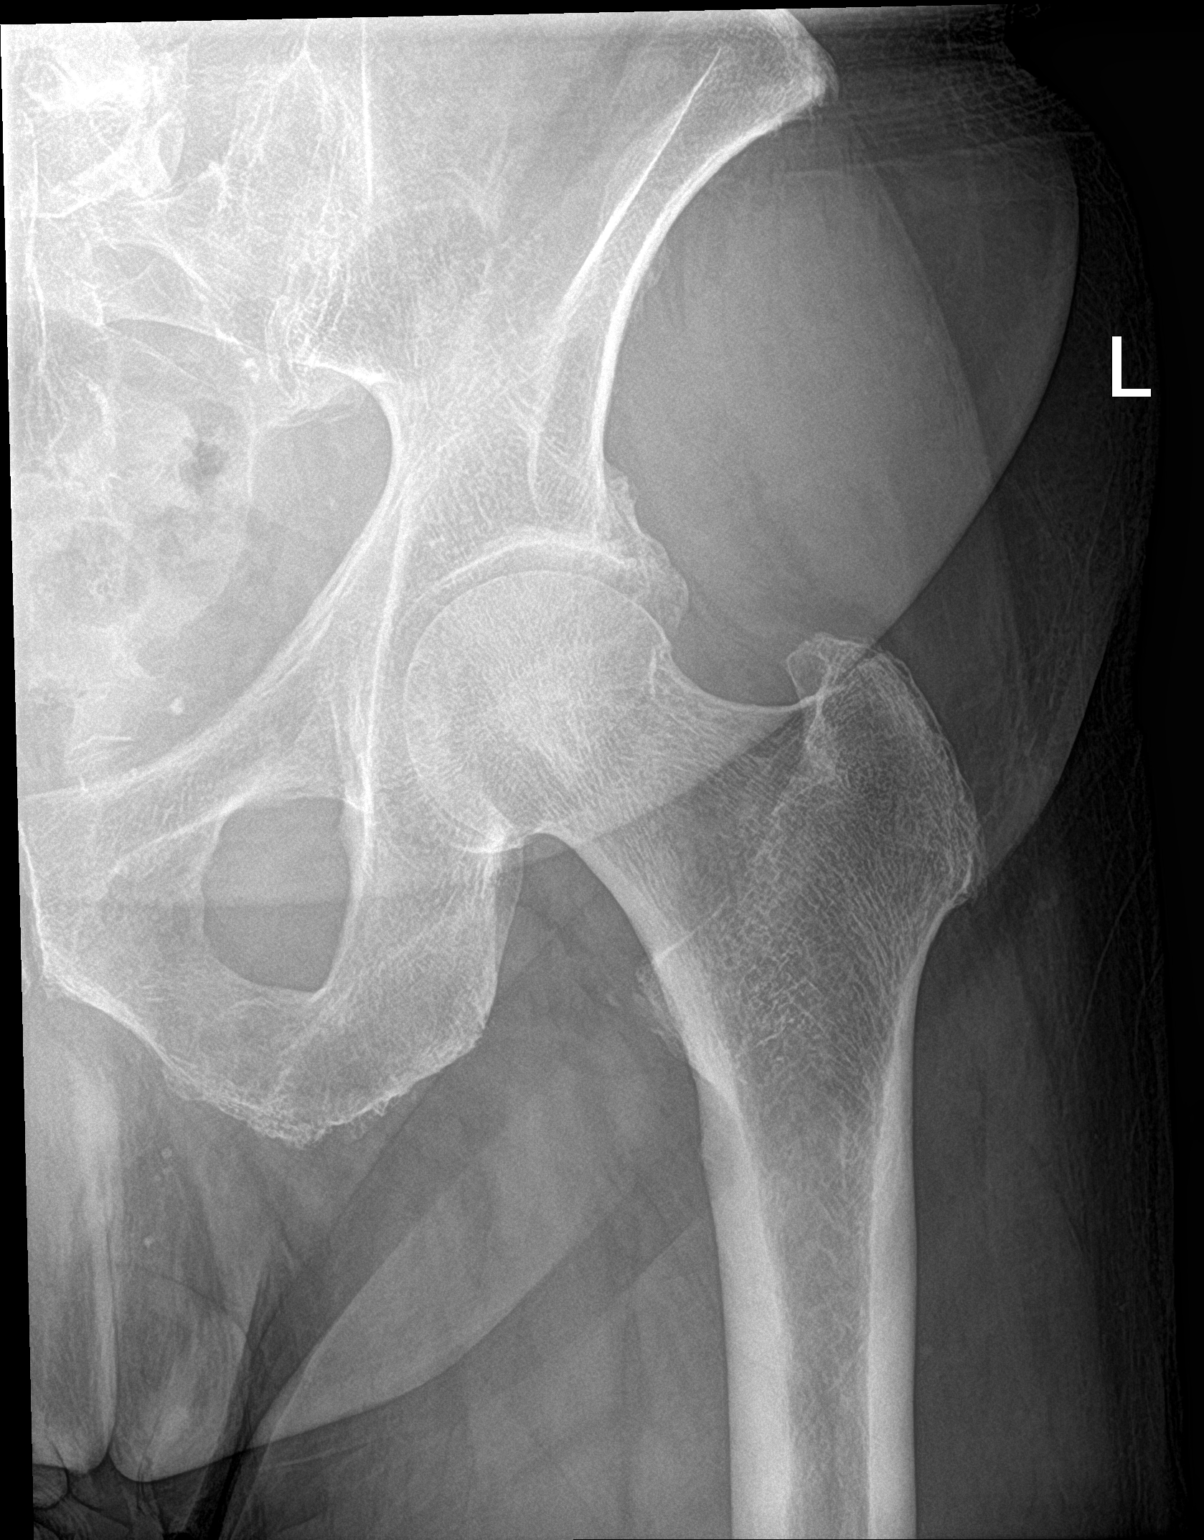

[hip frog leg]
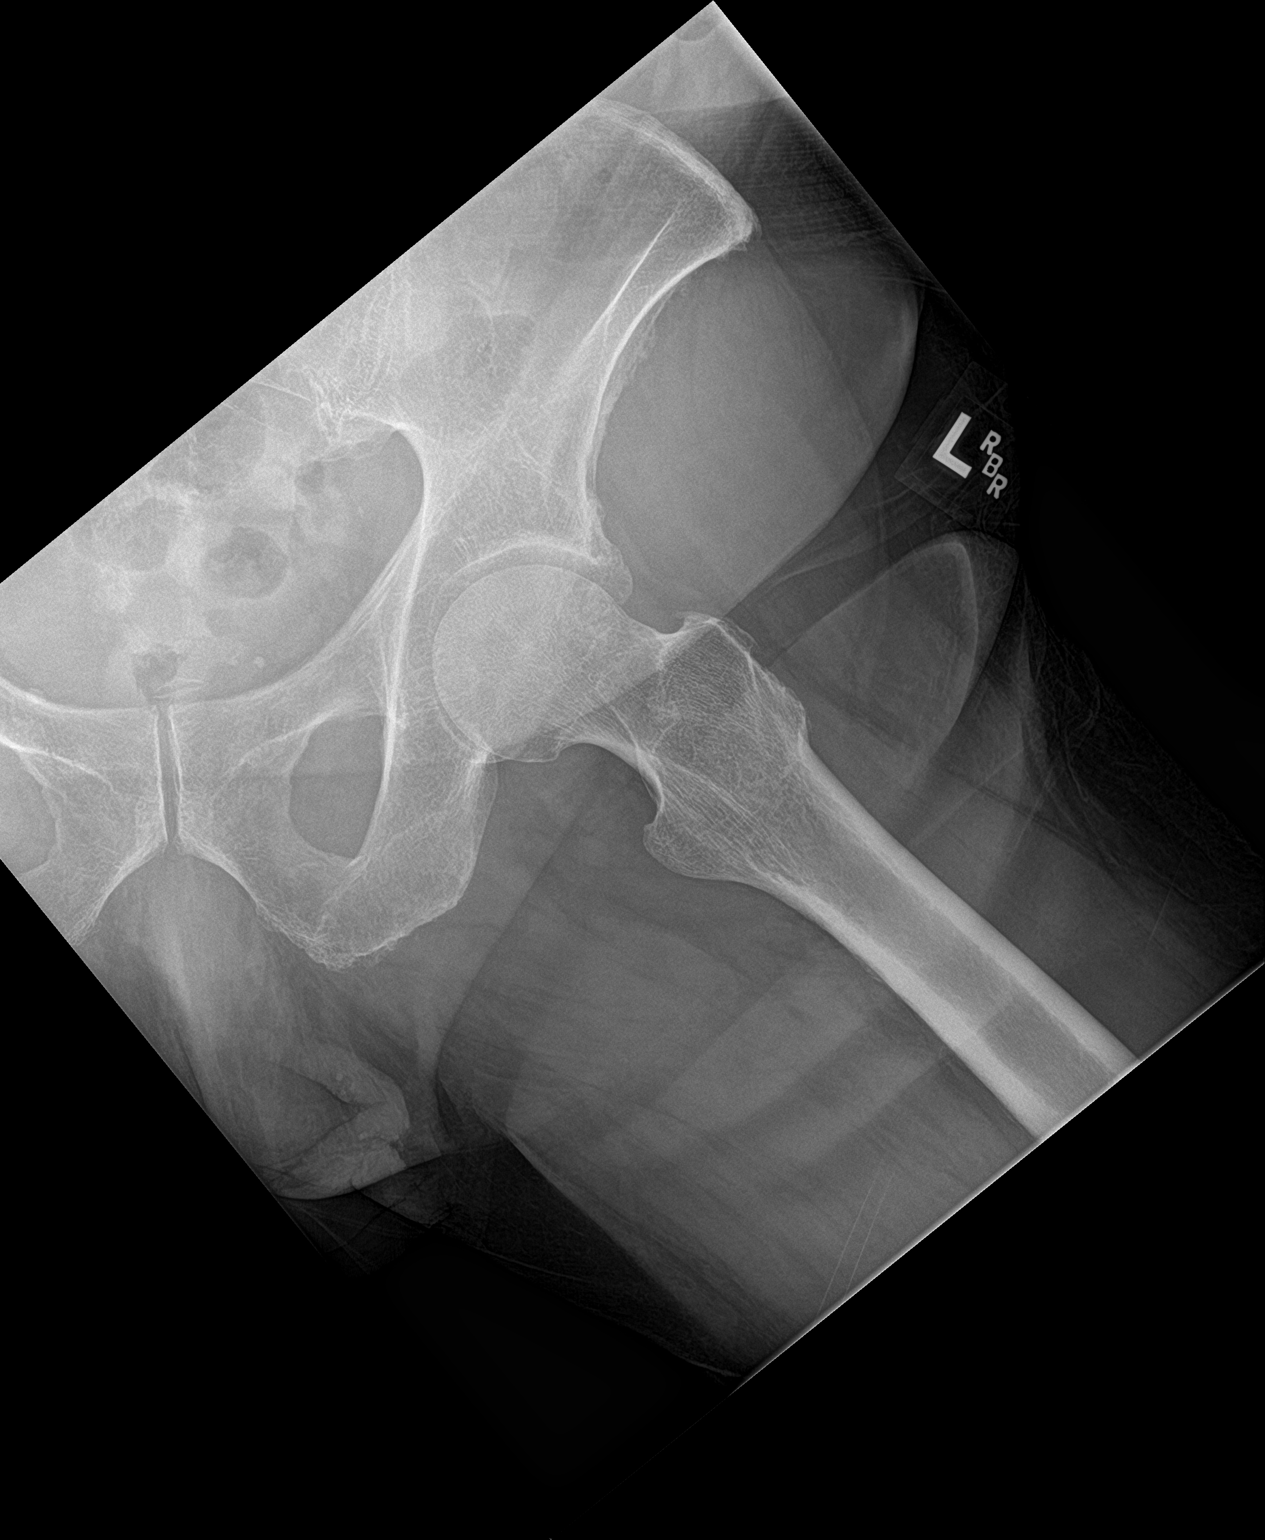

[3 of 3 positions shown; findings below may reference images not displayed]

FINDINGS: There is no evidence of hip fracture or dislocation. No significant
arthropathy of the left hip. Mild osteoarthritis of the right hip.
IMPRESSION: No acute osseous injury of the left hip.

## 2017-07-15 ENCOUNTER — Emergency Department (INDEPENDENT_AMBULATORY_CARE_PROVIDER_SITE_OTHER)
Admission: EM | Admit: 2017-07-15 | Discharge: 2017-07-15 | Disposition: A | Discharge status: Home / Self Care | Payer: Medicare Other | Source: Home / Self Care | Attending: Family Medicine | Admitting: Family Medicine

## 2017-07-15 ENCOUNTER — Other Ambulatory Visit: Payer: Self-pay

## 2017-07-15 ENCOUNTER — Emergency Department (INDEPENDENT_AMBULATORY_CARE_PROVIDER_SITE_OTHER): Payer: Medicare Other

## 2017-07-15 DIAGNOSIS — R0781 Pleurodynia: Secondary | ICD-10-CM | POA: Diagnosis not present

## 2017-07-15 DIAGNOSIS — Z87891 Personal history of nicotine dependence: Secondary | ICD-10-CM

## 2017-07-15 DIAGNOSIS — S299XXA Unspecified injury of thorax, initial encounter: Secondary | ICD-10-CM | POA: Diagnosis not present

## 2017-07-15 DIAGNOSIS — W010XXA Fall on same level from slipping, tripping and stumbling without subsequent striking against object, initial encounter: Secondary | ICD-10-CM

## 2017-07-15 DIAGNOSIS — S2231XA Fracture of one rib, right side, initial encounter for closed fracture: Secondary | ICD-10-CM | POA: Diagnosis not present

## 2017-07-15 NOTE — ED Provider Notes (Signed)
[name]    CSN: [id number] Arrival date & time: 07/15/17  1008     History   Chief Complaint Chief Complaint  Patient presents with  . Back Pain    HPI Dale Lowe is a 71 y.o. male.   HPI Dale Lowe is a 71 y.o. male presenting to UC with c/o Right sided lower rib pain radiating into his Right mid back that started about 2 weeks ago after falling onto the side walk.  Pt was trying to fix his sidewalk where a root had come up so that others would not trip but when he went to stand, his Left knee "gave out" causing him to fall.  Pain was mild at that time but has gradually worsened. Pain is aching and sore, worse with certain movements, deep breathing, and coughing or sneezing.  He initially had a bruise that has since resolved. He has tried ice and heat and is able to fall asleep using the heating pad. He has not taken any pain medication but he does take a daily baby aspirin.  He denies having pain or other episodes of weakness in Left knee. He has been using a cane to ambulate "just in case" his knee gives way again.   Past Medical History:  Diagnosis Date  . Acute angina ([place])   . Anxiety   . Cancer ([place])    skin CA  . Diabetes mellitus   . Hyperlipidemia   . Hypertension   . PTSD (post-traumatic stress disorder)   . Seizures [facility])     Patient Active Problem List   Diagnosis Date Noted  . COUGH 10/16/2010  . ADHESIVE CAPSULITIS OF SHOULDER 10/24/2009  . SHOULDER STRAIN, RIGHT 10/24/2009  . ESSENTIAL HYPERTENSION, BENIGN 01/10/2009  . ANGINA PECTORIS 01/10/2009  . IMPAIRED FASTING GLUCOSE 01/10/2009  . DERMATITIS, ATOPIC 12/20/2008  . ACTINIC KERATOSIS 11/08/2008  . POISON IVY DERMATITIS 05/31/2008  . Contact dermatitis and other eczema, due to unspecified cause 09/29/2007  . PRURITUS 09/29/2007    Past Surgical History:  Procedure Laterality Date  . CARDIAC CATHETERIZATION    . CHOLECYSTECTOMY         Home Medications    Prior to  Admission medications   Medication Sig Start Date End Date Taking? Authorizing Provider  aspirin 81 MG tablet Take 81 mg by mouth daily.    [provider]  atorvastatin (LIPITOR) 80 MG tablet Take 80 mg by mouth daily.    [provider]  carbamazepine (TEGRETOL XR) 200 MG 12 hr tablet Take 600 mg by mouth 2 (two) times daily.      [provider]  clonazePAM (KLONOPIN) 1 MG tablet Take 1 mg by mouth 2 (two) times daily as needed.      [provider]  diclofenac sodium (VOLTAREN) 1 % GEL Apply 2-4 g topically 3 (three) times daily as needed. For 1 week 01/19/17   Noe Gens, PA-C  fish oil-omega-3 fatty acids 1000 MG capsule Take 2 g by mouth daily.    [provider]  flunisolide (NASALIDE) 0.025 % SOLN Inhale 2 sprays into the lungs 2 (two) times daily.      [provider]  hydrOXYzine (ATARAX) 50 MG tablet Take 50 mg by mouth at bedtime as needed.      [provider]  levETIRAcetam (KEPPRA) 500 MG tablet Take 1,000 mg by mouth every 12 (twelve) hours.      [provider]  nitroGLYCERIN (NITRODUR - DOSED  IN MG/24 HR) 0.4 mg/hr Place 1 patch onto the skin daily.      [provider]  omeprazole (PRILOSEC) 20 MG capsule Take 20 mg by mouth daily.      [provider]  Skin Protectants, Misc. (EUCERIN) cream Apply topically 2 (two) times daily as needed.      [provider]    Family History Family History  Problem Relation Age of Onset  . Diabetes Father   . Heart attack Father   . Diabetes Sister   . Cancer Sister   . Cancer Sister     Social History Social History   Tobacco Use  . Smoking status: Former Smoker    Last attempt to quit: 09/09/1995    Years since quitting: 21.8  . Smokeless tobacco: Never Used  Substance Use Topics  . Alcohol use: No  . Drug use: No     Allergies   Patient has no known allergies.   Review of Systems Review of Systems  Constitutional:  Negative for chills and fatigue.  Respiratory: Negative for chest tightness, shortness of breath and wheezing.   Cardiovascular: Positive for chest pain. Negative for palpitations.  Skin: Negative for color change, rash and wound.     Physical Exam Triage Vital Signs ED Triage Vitals  Enc Vitals Group     BP 07/15/17 1034 (!) 151/80     Pulse Rate 07/15/17 1034 69     Resp --      Temp 07/15/17 1034 98.2 F (36.8 C)     Temp Source 07/15/17 1034 Oral     SpO2 07/15/17 1034 96 %     Weight 07/15/17 1035 178 lb (80.7 kg)     Height 07/15/17 1035 5\' 6"  (1.676 m)     Head Circumference --      Peak Flow --      Pain Score --      Pain Loc --      Pain Edu? --      Excl. in [place]? --    No data found.  Updated Vital Signs BP (!) 151/80 (BP Location: Right Arm)   Pulse 69   Temp 98.2 F (36.8 C) (Oral)   Ht 5\' 6"  (1.676 m)   Wt 178 lb (80.7 kg)   SpO2 96%   BMI 28.73 kg/m   Visual Acuity Right Eye Distance:   Left Eye Distance:   Bilateral Distance:    Right Eye Near:   Left Eye Near:    Bilateral Near:     Physical Exam  Constitutional: He is oriented to person, place, and time. He appears well-developed and well-nourished. No distress.  HENT:  Head: Normocephalic and atraumatic.  Eyes: EOM are normal.  Neck: Normal range of motion.  Cardiovascular: Normal rate and regular rhythm.  Pulmonary/Chest: Effort normal and breath sounds normal. No stridor. No respiratory distress. He has no wheezes. He has no rales.     He exhibits tenderness.  Tenderness to Right lower lateral and posterior ribs. No crepitus or deformity.    Musculoskeletal: Normal range of motion. He exhibits no edema or tenderness.  Left knee: full ROM, non-tender.  Neurological: He is alert and oriented to person, place, and time.  Skin: Skin is warm and dry. No rash noted. He is not diaphoretic. No erythema.  Psychiatric: He has a normal mood and affect. His behavior is normal.  Nursing note  and vitals reviewed.    UC Treatments / Results  Labs (all labs ordered are listed, but only abnormal results are displayed) Labs Reviewed - No data to display  EKG  EKG Interpretation None       Radiology Dg Ribs Unilateral W/chest Right  Result Date: 07/15/2017 CLINICAL DATA:  Status post fall 1 week ago landing on the right side. The patient reports increasing right rib pain and tenderness without re-injury. History of diabetes, former smoker. EXAM: RIGHT RIBS AND CHEST - 3+ VIEW COMPARISON:  Chest x-ray of May 26, 2013 and thoracic spine series of Jan 19, 2017. FINDINGS: The lungs are well-expanded and clear. The heart and pulmonary vascularity are normal. The mediastinum is normal in width. There are prominent right lateral endplate osteophytes in the mid and lower thoracic spine. The thoracic vertebral bodies are preserved in height where visualized. Right rib detail images include a metallic BB over the symptomatic region. There is subtle contour deformity of the lateral aspect of the right seventh rib seen on one view only. No pneumothorax or pleural effusion is observed. IMPRESSION: There is no acute cardiopulmonary abnormality. Possible possible minimally displaced fracture of the lateral aspect of the right seventh rib. Multilevel degenerative disc disease of the thoracic spine with prominent anterior and lateral endplate osteophytes. Electronically Signed   By: David  Martinique M.D.   On: 07/15/2017 11:06    Procedures Procedures (including critical care time)  Medications Ordered in UC Medications - No data to display   Initial Impression / Assessment and Plan / UC Course  I have reviewed the triage vital signs and the nursing notes.  Pertinent labs & imaging results that were available during my care of the patient were reviewed by me and considered in my medical decision making (see chart for details).     CXR c/w likely nondisplaced Right lateral 7th rib  fracture.  Pt given rib belt for comfort and incentive spirometer.  Encouraged to take acetaminophen and ibuprofen F/u with PCP or Sports Medicine in 1-2 weeks as needed.   Final Clinical Impressions(s) / UC Diagnoses   Final diagnoses:  Closed traumatic nondisplaced fracture of one rib of right side, initial encounter  Fall from slip, trip, or stumble, initial encounter    ED Discharge Orders    None       Controlled Substance Prescriptions [place] Controlled Substance Registry consulted? Not Applicable   Tyrell Antonio 07/15/17 1216

## 2017-07-15 NOTE — ED Triage Notes (Signed)
Pt fell on October 30.  He has been having increasing pain in the side radiating to the back since.

## 2017-07-15 NOTE — Discharge Instructions (Signed)
°  You may take 500mg  acetaminophen every 4-6 hours or in combination with ibuprofen 400mg  every 6-8 hours as needed for pain and inflammation.

## 2017-07-17 ENCOUNTER — Telehealth: Payer: Self-pay

## 2017-07-17 NOTE — Telephone Encounter (Signed)
Attempted to call no Vm.

## 2017-07-25 DIAGNOSIS — Z23 Encounter for immunization: Secondary | ICD-10-CM | POA: Diagnosis not present

## 2017-11-22 IMAGING — DX DG RIBS W/ CHEST 3+V*R*
4 series · 4 of 4 positions shown · non-contrast
Comparison: Chest x-ray of May 26, 2013 and thoracic spine
series of January 19, 2017.

CLINICAL DATA: Status post fall 1 week ago landing on the right
side. The patient reports increasing right rib pain and tenderness
without re-injury. History of diabetes, former smoker.

EXAM:
RIGHT RIBS AND CHEST - 3+ VIEW

[chest pa]
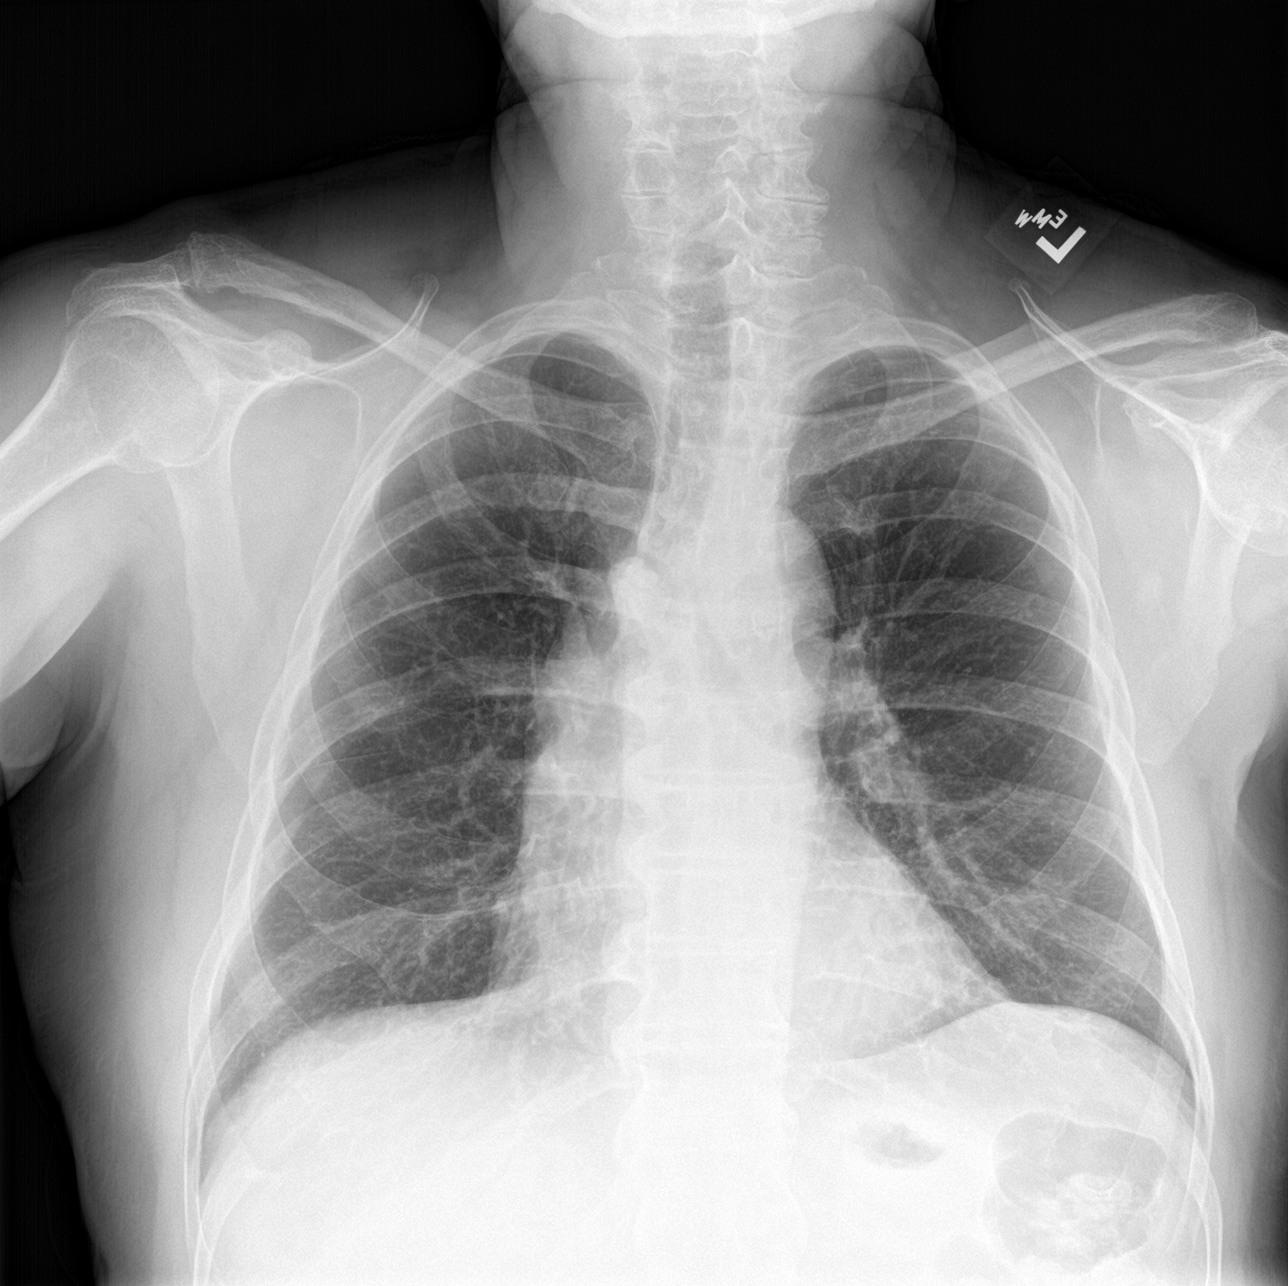

[rib ap]
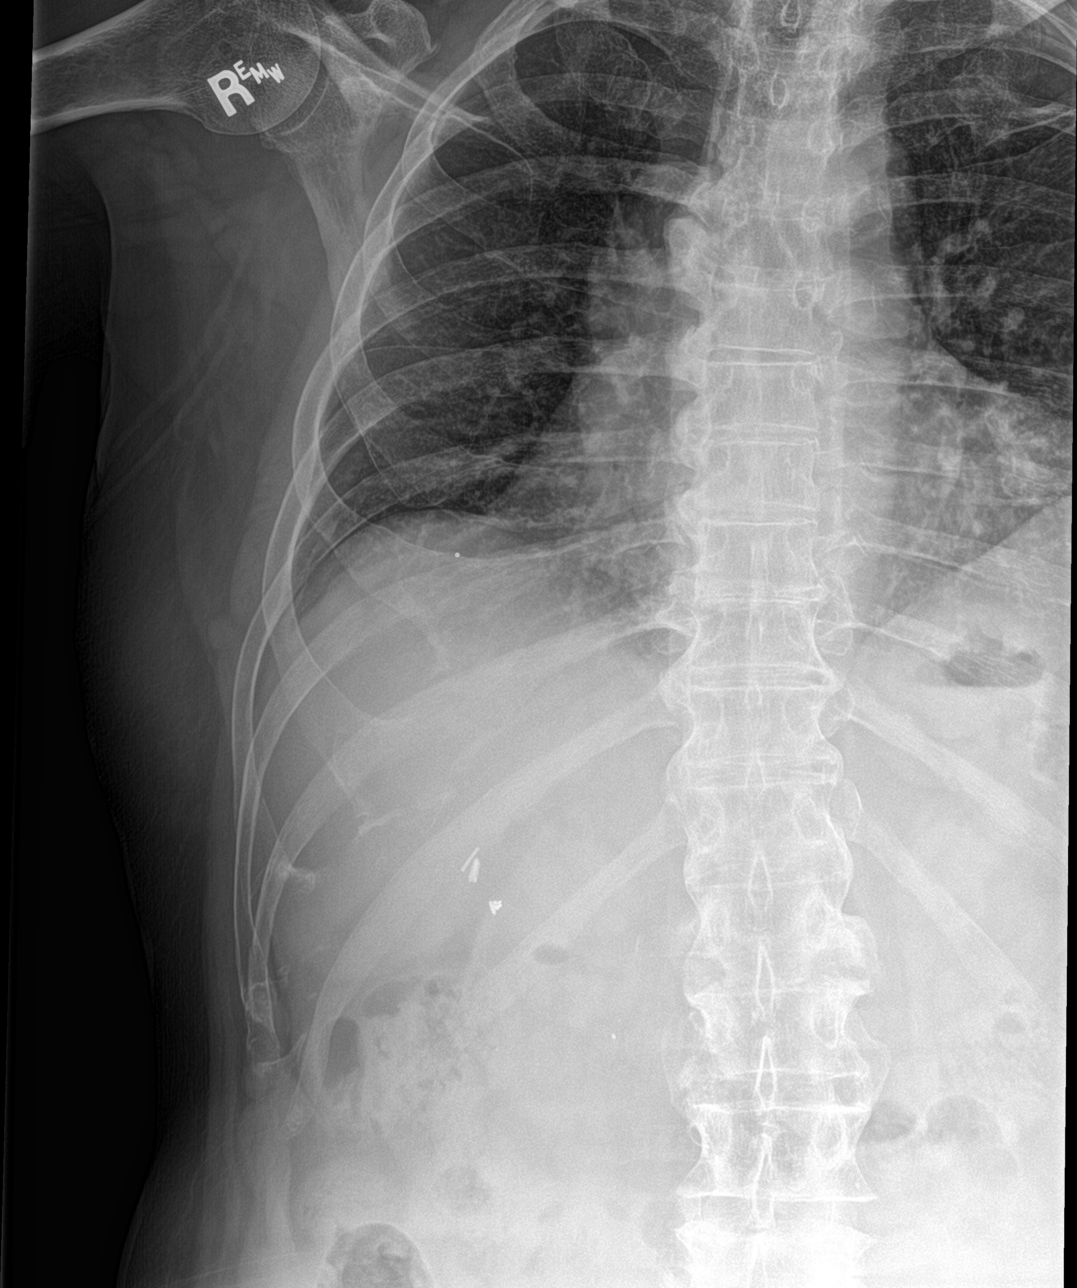

[rib ap obl (1 of 2)]
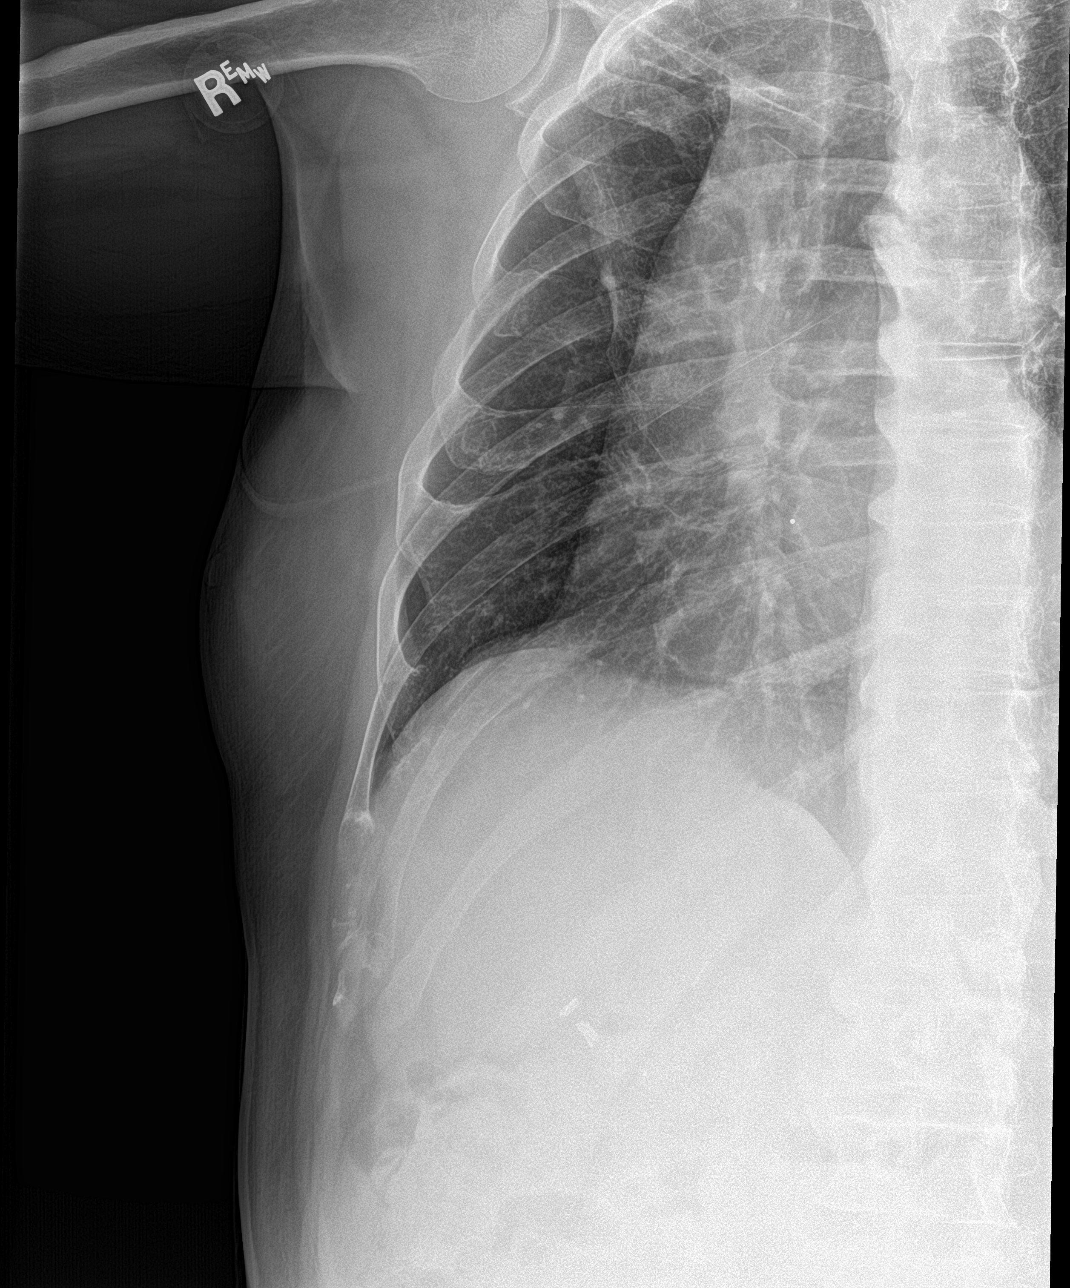

[rib ap obl (2 of 2)]
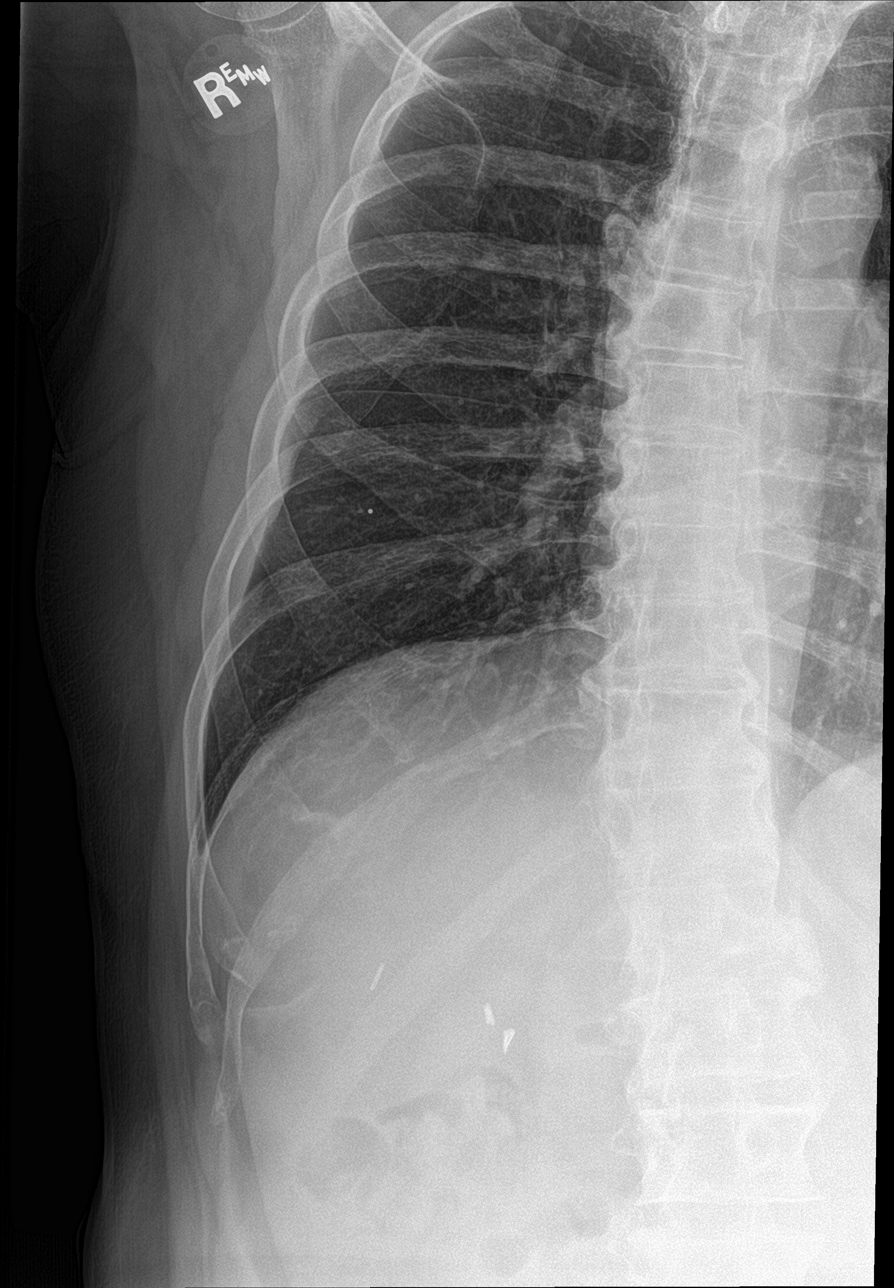

[4 of 4 positions shown; findings below may reference images not displayed]

FINDINGS: The lungs are well-expanded and clear. The heart and pulmonary
vascularity are normal. The mediastinum is normal in width. There
are prominent right lateral endplate osteophytes in the mid and
lower thoracic spine. The thoracic vertebral bodies are preserved in
height where visualized.

Right rib detail images include a metallic BB over the symptomatic
region. There is subtle contour deformity of the lateral aspect of
the right seventh rib seen on one view only. No pneumothorax or
pleural effusion is observed.
IMPRESSION: There is no acute cardiopulmonary abnormality.

Possible possible minimally displaced fracture of the lateral aspect
of the right seventh rib.

Multilevel degenerative disc disease of the thoracic spine with
prominent anterior and lateral endplate osteophytes.

## 2018-08-18 DIAGNOSIS — Z23 Encounter for immunization: Secondary | ICD-10-CM | POA: Diagnosis not present

## 2018-08-27 DIAGNOSIS — Z79899 Other long term (current) drug therapy: Secondary | ICD-10-CM | POA: Diagnosis not present

## 2018-08-27 DIAGNOSIS — Z8669 Personal history of other diseases of the nervous system and sense organs: Secondary | ICD-10-CM | POA: Diagnosis not present

## 2018-08-27 DIAGNOSIS — I10 Essential (primary) hypertension: Secondary | ICD-10-CM | POA: Diagnosis not present

## 2018-08-27 DIAGNOSIS — R0902 Hypoxemia: Secondary | ICD-10-CM | POA: Diagnosis not present

## 2018-08-27 DIAGNOSIS — R61 Generalized hyperhidrosis: Secondary | ICD-10-CM | POA: Diagnosis not present

## 2018-08-27 DIAGNOSIS — M5489 Other dorsalgia: Secondary | ICD-10-CM | POA: Diagnosis not present

## 2018-08-27 DIAGNOSIS — S300XXA Contusion of lower back and pelvis, initial encounter: Secondary | ICD-10-CM | POA: Diagnosis not present

## 2018-08-27 DIAGNOSIS — W228XXA Striking against or struck by other objects, initial encounter: Secondary | ICD-10-CM | POA: Diagnosis not present

## 2018-08-27 DIAGNOSIS — Z87891 Personal history of nicotine dependence: Secondary | ICD-10-CM | POA: Diagnosis not present

## 2018-08-27 DIAGNOSIS — M47894 Other spondylosis, thoracic region: Secondary | ICD-10-CM | POA: Diagnosis not present

## 2018-08-27 DIAGNOSIS — M549 Dorsalgia, unspecified: Secondary | ICD-10-CM | POA: Diagnosis not present

## 2018-08-27 DIAGNOSIS — R52 Pain, unspecified: Secondary | ICD-10-CM | POA: Diagnosis not present

## 2018-08-27 DIAGNOSIS — M47896 Other spondylosis, lumbar region: Secondary | ICD-10-CM | POA: Diagnosis not present

## 2018-08-27 DIAGNOSIS — G40909 Epilepsy, unspecified, not intractable, without status epilepticus: Secondary | ICD-10-CM | POA: Diagnosis not present

## 2018-08-27 DIAGNOSIS — S79911A Unspecified injury of right hip, initial encounter: Secondary | ICD-10-CM | POA: Diagnosis not present

## 2018-08-27 DIAGNOSIS — E119 Type 2 diabetes mellitus without complications: Secondary | ICD-10-CM | POA: Diagnosis not present

## 2019-05-19 DIAGNOSIS — Z23 Encounter for immunization: Secondary | ICD-10-CM | POA: Diagnosis not present

## 2019-07-04 DIAGNOSIS — R52 Pain, unspecified: Secondary | ICD-10-CM | POA: Diagnosis not present

## 2019-07-04 DIAGNOSIS — E119 Type 2 diabetes mellitus without complications: Secondary | ICD-10-CM | POA: Diagnosis not present

## 2019-07-04 DIAGNOSIS — R339 Retention of urine, unspecified: Secondary | ICD-10-CM | POA: Diagnosis not present

## 2019-07-04 DIAGNOSIS — R531 Weakness: Secondary | ICD-10-CM | POA: Diagnosis not present

## 2019-07-04 DIAGNOSIS — Z79899 Other long term (current) drug therapy: Secondary | ICD-10-CM | POA: Diagnosis not present

## 2019-07-04 DIAGNOSIS — I10 Essential (primary) hypertension: Secondary | ICD-10-CM | POA: Diagnosis not present

## 2019-07-04 DIAGNOSIS — W19XXXA Unspecified fall, initial encounter: Secondary | ICD-10-CM | POA: Diagnosis not present

## 2019-07-04 DIAGNOSIS — R300 Dysuria: Secondary | ICD-10-CM | POA: Diagnosis not present

## 2019-07-04 DIAGNOSIS — Z87891 Personal history of nicotine dependence: Secondary | ICD-10-CM | POA: Diagnosis not present

## 2020-01-23 DIAGNOSIS — Z23 Encounter for immunization: Secondary | ICD-10-CM | POA: Diagnosis not present

## 2020-02-23 DIAGNOSIS — Z23 Encounter for immunization: Secondary | ICD-10-CM | POA: Diagnosis not present

## 2020-05-27 DIAGNOSIS — Z23 Encounter for immunization: Secondary | ICD-10-CM | POA: Diagnosis not present

## 2020-05-29 DIAGNOSIS — R0789 Other chest pain: Secondary | ICD-10-CM | POA: Diagnosis not present

## 2020-05-29 DIAGNOSIS — E119 Type 2 diabetes mellitus without complications: Secondary | ICD-10-CM | POA: Diagnosis not present

## 2020-05-29 DIAGNOSIS — Z87891 Personal history of nicotine dependence: Secondary | ICD-10-CM | POA: Diagnosis not present

## 2020-05-29 DIAGNOSIS — R9431 Abnormal electrocardiogram [ECG] [EKG]: Secondary | ICD-10-CM | POA: Diagnosis not present

## 2020-05-29 DIAGNOSIS — S20212A Contusion of left front wall of thorax, initial encounter: Secondary | ICD-10-CM | POA: Diagnosis not present

## 2020-05-29 DIAGNOSIS — I10 Essential (primary) hypertension: Secondary | ICD-10-CM | POA: Diagnosis not present

## 2020-05-29 DIAGNOSIS — R0781 Pleurodynia: Secondary | ICD-10-CM | POA: Diagnosis not present

## 2020-05-29 DIAGNOSIS — W19XXXA Unspecified fall, initial encounter: Secondary | ICD-10-CM | POA: Diagnosis not present

## 2020-05-29 DIAGNOSIS — Z79899 Other long term (current) drug therapy: Secondary | ICD-10-CM | POA: Diagnosis not present

## 2020-05-29 DIAGNOSIS — S299XXA Unspecified injury of thorax, initial encounter: Secondary | ICD-10-CM | POA: Diagnosis not present

## 2020-05-29 DIAGNOSIS — G8911 Acute pain due to trauma: Secondary | ICD-10-CM | POA: Diagnosis not present

## 2020-05-29 DIAGNOSIS — J9811 Atelectasis: Secondary | ICD-10-CM | POA: Diagnosis not present

## 2020-05-29 DIAGNOSIS — R52 Pain, unspecified: Secondary | ICD-10-CM | POA: Diagnosis not present

## 2020-09-06 ENCOUNTER — Other Ambulatory Visit: Payer: Self-pay

## 2020-09-06 ENCOUNTER — Ambulatory Visit (INDEPENDENT_AMBULATORY_CARE_PROVIDER_SITE_OTHER): Payer: Medicare Other | Admitting: Family Medicine

## 2020-09-06 ENCOUNTER — Encounter: Payer: Self-pay | Admitting: Family Medicine

## 2020-09-06 VITALS — BP 132/51 | HR 65 | Ht 66.0 in | Wt 177.0 lb

## 2020-09-06 DIAGNOSIS — K219 Gastro-esophageal reflux disease without esophagitis: Secondary | ICD-10-CM | POA: Insufficient documentation

## 2020-09-06 DIAGNOSIS — E785 Hyperlipidemia, unspecified: Secondary | ICD-10-CM | POA: Diagnosis not present

## 2020-09-06 DIAGNOSIS — I10 Essential (primary) hypertension: Secondary | ICD-10-CM

## 2020-09-06 DIAGNOSIS — K2100 Gastro-esophageal reflux disease with esophagitis, without bleeding: Secondary | ICD-10-CM

## 2020-09-06 DIAGNOSIS — E118 Type 2 diabetes mellitus with unspecified complications: Secondary | ICD-10-CM

## 2020-09-06 DIAGNOSIS — G40909 Epilepsy, unspecified, not intractable, without status epilepticus: Secondary | ICD-10-CM | POA: Diagnosis not present

## 2020-09-06 DIAGNOSIS — E1169 Type 2 diabetes mellitus with other specified complication: Secondary | ICD-10-CM | POA: Diagnosis not present

## 2020-09-06 DIAGNOSIS — L02412 Cutaneous abscess of left axilla: Secondary | ICD-10-CM

## 2020-09-06 DIAGNOSIS — F4310 Post-traumatic stress disorder, unspecified: Secondary | ICD-10-CM | POA: Diagnosis not present

## 2020-09-06 NOTE — Assessment & Plan Note (Signed)
Blood pressure looks okay today 

## 2020-09-06 NOTE — Progress Notes (Signed)
New Patient Office Visit  Subjective:  Patient ID: Dale Lowe, male    DOB: 10/31/1945  Age: 74 y.o. MRN: [id number]  CC:  Chief Complaint  Patient presents with  . Establish Care    HPI Dale Lowe presents for to establish care.  His wife has been a patient here for quite some time.  He does get some of his care at the [place].  History of diabetes and hypertension.  He is mostly diet controlled.  He also has a history of epilepsy and is controlled with medication he says his last seizure was about 2 years ago.  He is able to drive though he says he really only drives to the doctor's office or the grocery store he really limits his driving.  He also has a bump in the left axilla that he noticed a little over a week ago.  He said it looked red and angry and so wanted to have that looked at today he denies any drainage from the area it has been sore.  He is not really had any lesions like this before.  He has had a history of skin cancer on his forehead and so is concerned that this may be cancer as well as 2 of his sisters died from cancer.  Past Medical History:  Diagnosis Date  . Acute angina ([facility])   . Anxiety   . Cancer ([facility])    skin CA  . Diabetes mellitus   . Hyperlipidemia   . Hypertension   . PTSD (post-traumatic stress disorder)   . Seizures ([facility])     Past Surgical History:  Procedure Laterality Date  . CARDIAC CATHETERIZATION    . CHOLECYSTECTOMY      Family History  Problem Relation Age of Onset  . Diabetes Father   . Heart attack Father   . Diabetes Sister   . Cancer Sister   . Cancer Sister     Social History   Socioeconomic History  . Marital status: Married    Spouse name: Not on file  . Number of children: Not on file  . Years of education: Not on file  . Highest education level: Not on file  Occupational History  . Not on file  Tobacco Use  . Smoking status: Former Smoker    Quit date: 09/09/1995    Years since quitting: 25.0  . Smokeless  tobacco: Never Used  Substance and Sexual Activity  . Alcohol use: No  . Drug use: No  . Sexual activity: Not on file    Comment: married, no kids, poor diet,no exercise.  Other Topics Concern  . Not on file  Social History Narrative  . Not on file   Social Determinants of Health   Financial Resource Strain: Not on file  Food Insecurity: Not on file  Transportation Needs: Not on file  Physical Activity: Not on file  Stress: Not on file  Social Connections: Not on file  Intimate Partner Violence: Not on file    ROS Review of Systems  Constitutional: Negative for diaphoresis, fever and unexpected weight change.  HENT: Negative for hearing loss, rhinorrhea, sneezing and tinnitus.   Eyes: Negative for visual disturbance.  Respiratory: Negative for cough and wheezing.   Cardiovascular: Negative for chest pain and palpitations.  Gastrointestinal: Negative for blood in stool, diarrhea, nausea and vomiting.  Genitourinary: Negative for dysuria and penile discharge.  Musculoskeletal: Negative for arthralgias and myalgias.  Skin: Negative for rash.  Neurological: Negative for headaches.  Hematological: Negative for adenopathy.  Psychiatric/Behavioral: Negative for dysphoric mood and sleep disturbance. The patient is not nervous/anxious.     Objective:   Today's Vitals: BP (!) 132/51   Pulse 65   Ht 5\' 6"  (1.676 m)   Wt 177 lb (80.3 kg)   SpO2 100%   BMI 28.57 kg/m   Physical Exam Vitals reviewed.  Constitutional:      Appearance: He is well-developed and well-nourished.  HENT:     Head: Normocephalic and atraumatic.  Eyes:     Extraocular Movements: EOM normal.     Conjunctiva/sclera: Conjunctivae normal.  Cardiovascular:     Rate and Rhythm: Normal rate.  Pulmonary:     Effort: Pulmonary effort is normal.  Skin:    General: Skin is dry.     Coloration: Skin is not pale.     Comments: Erythematous papular lesion in the left axilla measuring approximately 2 cm in  size.  No active drainage.  Mild erythema.  Neurological:     Mental Status: He is alert and oriented to person, place, and time.  Psychiatric:        Mood and Affect: Mood and affect normal.        Behavior: Behavior normal.     Assessment & Plan:   Problem List Items Addressed This Visit      Cardiovascular and Mediastinum   ESSENTIAL HYPERTENSION, BENIGN - Primary    Blood pressure looks okay today.        Digestive   GERD (gastroesophageal reflux disease)     Endocrine   Hyperlipidemia associated with type 2 diabetes mellitus ([facility])    We need to get updated records to see if his lipids are under good control or not.  But he is taking 80 mg of atorvastatin and tolerating that well.      Controlled diabetes mellitus type 2 with complications ([facility])    Ports that he has been able to control his A1c with just diet alone and is not on prescription medication.  The lab to get copy of his blood work from the we will try to get him to sign a release of records.        Nervous and Auditory   Epilepsy ([facility])    Stable over the last 2 years on his current medication regimen.        Other   PTSD (post-traumatic stress disorder)    Other Visit Diagnoses    Abscess of axilla, left       Relevant Orders   Wound culture      Abscess of left axilla.  Discussed treatment options.  He is very worried about potential for cancer so we discussed lancing it just to make sure that it is improving and healing well.  He agreed to do that.  Wound care discussed.  Incision and Drainage Procedure Note  Pre-operative Diagnosis: Abscess left axilla.  Post-operative Diagnosis: Same  Indications: Erythema and tenderness  Anesthesia: 1% lidocaine with epinephrine  Procedure Details  The procedure, risks and complications have been discussed in detail (including, but not limited to airway compromise, infection, bleeding) with the patient, and the patient has signed consent to the  procedure.  The skin was sterilely prepped and draped over the affected area in the usual fashion. After adequate local anesthesia, I&D with a #11 blade was performed on the left axilla. Purulent drainage: absent The patient was observed until stable.  Findings: Wound culture sent for further evaluation.  EBL: Trace blood  Drains: none  Condition: Tolerated procedure well   Complications: none.    Outpatient Encounter Medications as of 09/06/2020  Medication Sig  . aspirin 81 MG tablet Take 81 mg by mouth daily.  Marland Kitchen atorvastatin (LIPITOR) 80 MG tablet Take 80 mg by mouth daily.  . carbamazepine (TEGRETOL XR) 200 MG 12 hr tablet Take 600 mg by mouth 2 (two) times daily.    . diclofenac sodium (VOLTAREN) 1 % GEL Apply 2-4 g topically 3 (three) times daily as needed. For 1 week  . fish oil-omega-3 fatty acids 1000 MG capsule Take 2 g by mouth daily.  . flunisolide (NASALIDE) 0.025 % SOLN Inhale 2 sprays into the lungs 2 (two) times daily.    . hydrOXYzine (ATARAX) 50 MG tablet Take 50 mg by mouth at bedtime as needed.    . levETIRAcetam (KEPPRA) 500 MG tablet Take 1,000 mg by mouth every 12 (twelve) hours.    . nitroGLYCERIN (NITRODUR - DOSED IN MG/24 HR) 0.4 mg/hr Place 1 patch onto the skin daily.    Marland Kitchen omeprazole (PRILOSEC) 20 MG capsule Take 20 mg by mouth daily.    . [DISCONTINUED] atenolol (TENORMIN) 100 MG tablet Take 100 mg by mouth daily.    . [DISCONTINUED] clonazePAM (KLONOPIN) 1 MG tablet Take 1 mg by mouth 2 (two) times daily as needed.    . [DISCONTINUED] Skin Protectants, Misc. (EUCERIN) cream Apply topically 2 (two) times daily as needed.    . [DISCONTINUED] venlafaxine (EFFEXOR) 75 MG tablet Take 225 mg by mouth daily.     No facility-administered encounter medications on file as of 09/06/2020.    Follow-up: Return in about 6 months (around 03/07/2021) for bp/ifg.   Beatrice Lecher, MD

## 2020-09-06 NOTE — Assessment & Plan Note (Signed)
We need to get updated records to see if his lipids are under good control or not.  But he is taking 80 mg of atorvastatin and tolerating that well.

## 2020-09-06 NOTE — Assessment & Plan Note (Signed)
Ports that he has been able to control his A1c with just diet alone and is not on prescription medication.  The lab to get copy of his blood work from the [place] we will try to get him to sign a release of records.

## 2020-09-06 NOTE — Assessment & Plan Note (Addendum)
Stable over the last 2 years on his current medication regimen.

## 2020-09-07 LAB — WOUND CULTURE: MICRO NUMBER:: 11372981

## 2020-09-08 LAB — WOUND CULTURE: RESULT:: NO GROWTH

## 2020-09-09 LAB — WOUND CULTURE: SPECIMEN QUALITY:: ADEQUATE

## 2021-03-07 ENCOUNTER — Ambulatory Visit: Payer: Medicare Other | Admitting: Family Medicine

## 2021-04-03 DIAGNOSIS — Z87891 Personal history of nicotine dependence: Secondary | ICD-10-CM | POA: Diagnosis not present

## 2021-04-03 DIAGNOSIS — R2681 Unsteadiness on feet: Secondary | ICD-10-CM | POA: Diagnosis not present

## 2021-04-03 DIAGNOSIS — G40909 Epilepsy, unspecified, not intractable, without status epilepticus: Secondary | ICD-10-CM | POA: Diagnosis not present

## 2021-04-03 DIAGNOSIS — R4182 Altered mental status, unspecified: Secondary | ICD-10-CM | POA: Diagnosis not present

## 2021-04-03 DIAGNOSIS — S0990XA Unspecified injury of head, initial encounter: Secondary | ICD-10-CM | POA: Diagnosis not present

## 2021-04-03 DIAGNOSIS — R9431 Abnormal electrocardiogram [ECG] [EKG]: Secondary | ICD-10-CM | POA: Diagnosis not present

## 2021-04-03 DIAGNOSIS — E119 Type 2 diabetes mellitus without complications: Secondary | ICD-10-CM | POA: Diagnosis not present

## 2021-04-03 DIAGNOSIS — R410 Disorientation, unspecified: Secondary | ICD-10-CM | POA: Diagnosis not present

## 2021-04-03 DIAGNOSIS — I10 Essential (primary) hypertension: Secondary | ICD-10-CM | POA: Diagnosis not present

## 2021-04-03 DIAGNOSIS — Z20822 Contact with and (suspected) exposure to covid-19: Secondary | ICD-10-CM | POA: Diagnosis not present

## 2021-04-03 DIAGNOSIS — R5381 Other malaise: Secondary | ICD-10-CM | POA: Diagnosis not present

## 2021-04-03 DIAGNOSIS — F0781 Postconcussional syndrome: Secondary | ICD-10-CM | POA: Diagnosis not present

## 2021-04-03 DIAGNOSIS — W19XXXA Unspecified fall, initial encounter: Secondary | ICD-10-CM | POA: Diagnosis not present

## 2021-04-03 DIAGNOSIS — R531 Weakness: Secondary | ICD-10-CM | POA: Diagnosis not present

## 2021-04-03 DIAGNOSIS — G44309 Post-traumatic headache, unspecified, not intractable: Secondary | ICD-10-CM | POA: Diagnosis not present

## 2021-04-03 DIAGNOSIS — G44319 Acute post-traumatic headache, not intractable: Secondary | ICD-10-CM | POA: Diagnosis not present

## 2021-04-03 DIAGNOSIS — Z79899 Other long term (current) drug therapy: Secondary | ICD-10-CM | POA: Diagnosis not present

## 2021-04-04 ENCOUNTER — Telehealth: Payer: Self-pay | Admitting: General Practice

## 2021-04-04 NOTE — Telephone Encounter (Signed)
Transition Care Management Unsuccessful Follow-up Telephone Call  Date of discharge and from where:  04/03/21 from [facility]  Attempts:  1st Attempt  Reason for unsuccessful TCM follow-up call:  Left voice message

## 2021-04-05 NOTE — Telephone Encounter (Signed)
Transition Care Management Unsuccessful Follow-up Telephone Call  Date of discharge and from where:  04/03/21 from [facility]  Attempts:  2nd Attempt  Reason for unsuccessful TCM follow-up call:  Left voice message

## 2021-04-08 NOTE — Telephone Encounter (Signed)
Transition Care Management Unsuccessful Follow-up Telephone Call  Date of discharge and from where:  04/03/21 from [facility]  Attempts:  3rd Attempt  Reason for unsuccessful TCM follow-up call:  Left voice message

## 2021-09-10 DIAGNOSIS — G40909 Epilepsy, unspecified, not intractable, without status epilepticus: Secondary | ICD-10-CM | POA: Diagnosis not present

## 2021-09-10 DIAGNOSIS — I951 Orthostatic hypotension: Secondary | ICD-10-CM | POA: Diagnosis not present

## 2021-09-10 DIAGNOSIS — M542 Cervicalgia: Secondary | ICD-10-CM | POA: Diagnosis not present

## 2021-09-10 DIAGNOSIS — F419 Anxiety disorder, unspecified: Secondary | ICD-10-CM | POA: Diagnosis not present

## 2021-09-10 DIAGNOSIS — E785 Hyperlipidemia, unspecified: Secondary | ICD-10-CM | POA: Diagnosis not present

## 2021-09-10 DIAGNOSIS — R109 Unspecified abdominal pain: Secondary | ICD-10-CM | POA: Diagnosis not present

## 2021-09-10 DIAGNOSIS — R079 Chest pain, unspecified: Secondary | ICD-10-CM | POA: Diagnosis not present

## 2021-09-10 DIAGNOSIS — D72829 Elevated white blood cell count, unspecified: Secondary | ICD-10-CM | POA: Diagnosis not present

## 2021-09-10 DIAGNOSIS — S0990XA Unspecified injury of head, initial encounter: Secondary | ICD-10-CM | POA: Diagnosis not present

## 2021-09-10 DIAGNOSIS — Z888 Allergy status to other drugs, medicaments and biological substances status: Secondary | ICD-10-CM | POA: Diagnosis not present

## 2021-09-10 DIAGNOSIS — S59911A Unspecified injury of right forearm, initial encounter: Secondary | ICD-10-CM | POA: Diagnosis not present

## 2021-09-10 DIAGNOSIS — M545 Low back pain, unspecified: Secondary | ICD-10-CM | POA: Diagnosis not present

## 2021-09-10 DIAGNOSIS — W19XXXA Unspecified fall, initial encounter: Secondary | ICD-10-CM | POA: Diagnosis not present

## 2021-09-10 DIAGNOSIS — G8911 Acute pain due to trauma: Secondary | ICD-10-CM | POA: Diagnosis not present

## 2021-09-10 DIAGNOSIS — I10 Essential (primary) hypertension: Secondary | ICD-10-CM | POA: Diagnosis not present

## 2021-09-10 DIAGNOSIS — Z743 Need for continuous supervision: Secondary | ICD-10-CM | POA: Diagnosis not present

## 2021-09-10 DIAGNOSIS — S32048A Other fracture of fourth lumbar vertebra, initial encounter for closed fracture: Secondary | ICD-10-CM | POA: Diagnosis not present

## 2021-09-10 DIAGNOSIS — S32038A Other fracture of third lumbar vertebra, initial encounter for closed fracture: Secondary | ICD-10-CM | POA: Diagnosis not present

## 2021-09-10 DIAGNOSIS — E119 Type 2 diabetes mellitus without complications: Secondary | ICD-10-CM | POA: Diagnosis not present

## 2021-09-10 DIAGNOSIS — Z79899 Other long term (current) drug therapy: Secondary | ICD-10-CM | POA: Diagnosis not present

## 2021-09-10 DIAGNOSIS — Z87891 Personal history of nicotine dependence: Secondary | ICD-10-CM | POA: Diagnosis not present

## 2021-09-10 DIAGNOSIS — M549 Dorsalgia, unspecified: Secondary | ICD-10-CM | POA: Diagnosis not present

## 2021-09-10 DIAGNOSIS — R2689 Other abnormalities of gait and mobility: Secondary | ICD-10-CM | POA: Diagnosis not present

## 2021-09-10 DIAGNOSIS — M25511 Pain in right shoulder: Secondary | ICD-10-CM | POA: Diagnosis not present

## 2021-09-11 DIAGNOSIS — Z79899 Other long term (current) drug therapy: Secondary | ICD-10-CM | POA: Diagnosis not present

## 2021-09-11 DIAGNOSIS — Y92009 Unspecified place in unspecified non-institutional (private) residence as the place of occurrence of the external cause: Secondary | ICD-10-CM | POA: Diagnosis not present

## 2021-09-11 DIAGNOSIS — S32009A Unspecified fracture of unspecified lumbar vertebra, initial encounter for closed fracture: Secondary | ICD-10-CM | POA: Diagnosis not present

## 2021-09-11 DIAGNOSIS — I10 Essential (primary) hypertension: Secondary | ICD-10-CM | POA: Diagnosis not present

## 2021-09-11 DIAGNOSIS — E785 Hyperlipidemia, unspecified: Secondary | ICD-10-CM | POA: Diagnosis not present

## 2021-09-11 DIAGNOSIS — E119 Type 2 diabetes mellitus without complications: Secondary | ICD-10-CM | POA: Diagnosis not present

## 2021-09-11 DIAGNOSIS — W19XXXA Unspecified fall, initial encounter: Secondary | ICD-10-CM | POA: Diagnosis not present

## 2021-09-11 DIAGNOSIS — F419 Anxiety disorder, unspecified: Secondary | ICD-10-CM | POA: Diagnosis not present

## 2021-09-11 DIAGNOSIS — D72829 Elevated white blood cell count, unspecified: Secondary | ICD-10-CM | POA: Diagnosis not present

## 2021-09-11 DIAGNOSIS — G40909 Epilepsy, unspecified, not intractable, without status epilepticus: Secondary | ICD-10-CM | POA: Diagnosis not present

## 2021-09-12 DIAGNOSIS — E119 Type 2 diabetes mellitus without complications: Secondary | ICD-10-CM | POA: Diagnosis not present

## 2021-09-12 DIAGNOSIS — Y92009 Unspecified place in unspecified non-institutional (private) residence as the place of occurrence of the external cause: Secondary | ICD-10-CM | POA: Diagnosis not present

## 2021-09-12 DIAGNOSIS — W19XXXA Unspecified fall, initial encounter: Secondary | ICD-10-CM | POA: Diagnosis not present

## 2021-09-15 DIAGNOSIS — R404 Transient alteration of awareness: Secondary | ICD-10-CM | POA: Diagnosis not present

## 2021-09-15 DIAGNOSIS — R911 Solitary pulmonary nodule: Secondary | ICD-10-CM | POA: Diagnosis not present

## 2021-09-15 DIAGNOSIS — J9621 Acute and chronic respiratory failure with hypoxia: Secondary | ICD-10-CM | POA: Diagnosis not present

## 2021-09-15 DIAGNOSIS — R4182 Altered mental status, unspecified: Secondary | ICD-10-CM | POA: Diagnosis not present

## 2021-09-15 DIAGNOSIS — J1282 Pneumonia due to coronavirus disease 2019: Secondary | ICD-10-CM | POA: Diagnosis not present

## 2021-09-15 DIAGNOSIS — R0602 Shortness of breath: Secondary | ICD-10-CM | POA: Diagnosis not present

## 2021-09-15 DIAGNOSIS — J9622 Acute and chronic respiratory failure with hypercapnia: Secondary | ICD-10-CM | POA: Diagnosis not present

## 2021-09-15 DIAGNOSIS — Z9981 Dependence on supplemental oxygen: Secondary | ICD-10-CM | POA: Diagnosis not present

## 2021-09-15 DIAGNOSIS — A4189 Other specified sepsis: Secondary | ICD-10-CM | POA: Diagnosis not present

## 2021-09-15 DIAGNOSIS — R000 Tachycardia, unspecified: Secondary | ICD-10-CM | POA: Diagnosis not present

## 2021-09-15 DIAGNOSIS — U071 COVID-19: Secondary | ICD-10-CM | POA: Diagnosis not present

## 2021-09-15 DIAGNOSIS — I452 Bifascicular block: Secondary | ICD-10-CM | POA: Diagnosis not present

## 2021-09-15 DIAGNOSIS — R0689 Other abnormalities of breathing: Secondary | ICD-10-CM | POA: Diagnosis not present

## 2021-09-15 DIAGNOSIS — J9691 Respiratory failure, unspecified with hypoxia: Secondary | ICD-10-CM | POA: Diagnosis not present

## 2021-09-15 DIAGNOSIS — I2510 Atherosclerotic heart disease of native coronary artery without angina pectoris: Secondary | ICD-10-CM | POA: Diagnosis not present

## 2021-09-15 DIAGNOSIS — I444 Left anterior fascicular block: Secondary | ICD-10-CM | POA: Diagnosis not present

## 2021-09-15 DIAGNOSIS — I4510 Unspecified right bundle-branch block: Secondary | ICD-10-CM | POA: Diagnosis not present

## 2021-09-15 DIAGNOSIS — K6389 Other specified diseases of intestine: Secondary | ICD-10-CM | POA: Diagnosis not present

## 2021-09-15 DIAGNOSIS — R778 Other specified abnormalities of plasma proteins: Secondary | ICD-10-CM | POA: Diagnosis not present

## 2021-09-15 DIAGNOSIS — Z743 Need for continuous supervision: Secondary | ICD-10-CM | POA: Diagnosis not present

## 2021-09-15 DIAGNOSIS — I2584 Coronary atherosclerosis due to calcified coronary lesion: Secondary | ICD-10-CM | POA: Diagnosis not present

## 2021-09-15 DIAGNOSIS — F29 Unspecified psychosis not due to a substance or known physiological condition: Secondary | ICD-10-CM | POA: Diagnosis not present

## 2021-09-16 DIAGNOSIS — R0600 Dyspnea, unspecified: Secondary | ICD-10-CM | POA: Diagnosis not present

## 2021-09-16 DIAGNOSIS — R778 Other specified abnormalities of plasma proteins: Secondary | ICD-10-CM | POA: Diagnosis not present

## 2021-09-16 DIAGNOSIS — J9602 Acute respiratory failure with hypercapnia: Secondary | ICD-10-CM | POA: Diagnosis not present

## 2021-09-16 DIAGNOSIS — Z9981 Dependence on supplemental oxygen: Secondary | ICD-10-CM | POA: Diagnosis not present

## 2021-09-16 DIAGNOSIS — U071 COVID-19: Secondary | ICD-10-CM | POA: Diagnosis not present

## 2021-09-16 DIAGNOSIS — J9601 Acute respiratory failure with hypoxia: Secondary | ICD-10-CM | POA: Diagnosis not present

## 2021-09-19 DIAGNOSIS — R531 Weakness: Secondary | ICD-10-CM | POA: Diagnosis not present

## 2021-09-19 DIAGNOSIS — R7989 Other specified abnormal findings of blood chemistry: Secondary | ICD-10-CM | POA: Diagnosis present

## 2021-09-19 DIAGNOSIS — G40919 Epilepsy, unspecified, intractable, without status epilepticus: Secondary | ICD-10-CM | POA: Diagnosis present

## 2021-09-19 DIAGNOSIS — R5381 Other malaise: Secondary | ICD-10-CM | POA: Diagnosis not present

## 2021-09-19 DIAGNOSIS — R270 Ataxia, unspecified: Secondary | ICD-10-CM | POA: Diagnosis present

## 2021-09-19 DIAGNOSIS — R778 Other specified abnormalities of plasma proteins: Secondary | ICD-10-CM | POA: Diagnosis not present

## 2021-09-19 DIAGNOSIS — I959 Hypotension, unspecified: Secondary | ICD-10-CM | POA: Diagnosis not present

## 2021-09-19 DIAGNOSIS — M6281 Muscle weakness (generalized): Secondary | ICD-10-CM | POA: Diagnosis present

## 2021-09-19 DIAGNOSIS — J9602 Acute respiratory failure with hypercapnia: Secondary | ICD-10-CM | POA: Diagnosis present

## 2021-09-19 DIAGNOSIS — S199XXA Unspecified injury of neck, initial encounter: Secondary | ICD-10-CM | POA: Diagnosis not present

## 2021-09-19 DIAGNOSIS — U071 COVID-19: Secondary | ICD-10-CM | POA: Diagnosis present

## 2021-09-19 DIAGNOSIS — Z5321 Procedure and treatment not carried out due to patient leaving prior to being seen by health care provider: Secondary | ICD-10-CM | POA: Diagnosis not present

## 2021-09-19 DIAGNOSIS — M4850XD Collapsed vertebra, not elsewhere classified, site unspecified, subsequent encounter for fracture with routine healing: Secondary | ICD-10-CM | POA: Diagnosis present

## 2021-09-19 DIAGNOSIS — Z794 Long term (current) use of insulin: Secondary | ICD-10-CM | POA: Diagnosis not present

## 2021-09-19 DIAGNOSIS — Z66 Do not resuscitate: Secondary | ICD-10-CM | POA: Diagnosis present

## 2021-09-19 DIAGNOSIS — E1169 Type 2 diabetes mellitus with other specified complication: Secondary | ICD-10-CM | POA: Diagnosis present

## 2021-09-19 DIAGNOSIS — G40909 Epilepsy, unspecified, not intractable, without status epilepticus: Secondary | ICD-10-CM | POA: Diagnosis present

## 2021-09-19 DIAGNOSIS — Z7409 Other reduced mobility: Secondary | ICD-10-CM | POA: Diagnosis present

## 2021-09-19 DIAGNOSIS — Z743 Need for continuous supervision: Secondary | ICD-10-CM | POA: Diagnosis not present

## 2021-09-19 DIAGNOSIS — F4310 Post-traumatic stress disorder, unspecified: Secondary | ICD-10-CM | POA: Diagnosis not present

## 2021-09-19 DIAGNOSIS — J441 Chronic obstructive pulmonary disease with (acute) exacerbation: Secondary | ICD-10-CM | POA: Diagnosis present

## 2021-09-19 DIAGNOSIS — I248 Other forms of acute ischemic heart disease: Secondary | ICD-10-CM | POA: Diagnosis present

## 2021-09-19 DIAGNOSIS — F32A Depression, unspecified: Secondary | ICD-10-CM | POA: Diagnosis present

## 2021-09-19 DIAGNOSIS — E119 Type 2 diabetes mellitus without complications: Secondary | ICD-10-CM | POA: Diagnosis present

## 2021-09-19 DIAGNOSIS — R1312 Dysphagia, oropharyngeal phase: Secondary | ICD-10-CM | POA: Diagnosis present

## 2021-09-19 DIAGNOSIS — R748 Abnormal levels of other serum enzymes: Secondary | ICD-10-CM | POA: Diagnosis not present

## 2021-09-19 DIAGNOSIS — E876 Hypokalemia: Secondary | ICD-10-CM | POA: Diagnosis present

## 2021-09-19 DIAGNOSIS — G8929 Other chronic pain: Secondary | ICD-10-CM | POA: Diagnosis not present

## 2021-09-19 DIAGNOSIS — J9601 Acute respiratory failure with hypoxia: Secondary | ICD-10-CM | POA: Diagnosis present

## 2021-09-19 DIAGNOSIS — I10 Essential (primary) hypertension: Secondary | ICD-10-CM | POA: Diagnosis present

## 2021-09-20 DIAGNOSIS — E119 Type 2 diabetes mellitus without complications: Secondary | ICD-10-CM | POA: Diagnosis not present

## 2021-09-20 DIAGNOSIS — R748 Abnormal levels of other serum enzymes: Secondary | ICD-10-CM | POA: Diagnosis not present

## 2021-09-20 DIAGNOSIS — R531 Weakness: Secondary | ICD-10-CM | POA: Diagnosis not present

## 2021-09-20 DIAGNOSIS — G40909 Epilepsy, unspecified, not intractable, without status epilepticus: Secondary | ICD-10-CM | POA: Diagnosis not present

## 2021-09-20 DIAGNOSIS — F4310 Post-traumatic stress disorder, unspecified: Secondary | ICD-10-CM | POA: Diagnosis not present

## 2021-09-20 DIAGNOSIS — Z794 Long term (current) use of insulin: Secondary | ICD-10-CM | POA: Diagnosis not present

## 2021-09-20 DIAGNOSIS — U071 COVID-19: Secondary | ICD-10-CM | POA: Diagnosis not present

## 2021-09-20 DIAGNOSIS — E876 Hypokalemia: Secondary | ICD-10-CM | POA: Diagnosis not present

## 2021-09-21 DIAGNOSIS — G8929 Other chronic pain: Secondary | ICD-10-CM | POA: Diagnosis not present

## 2021-09-21 DIAGNOSIS — M4850XD Collapsed vertebra, not elsewhere classified, site unspecified, subsequent encounter for fracture with routine healing: Secondary | ICD-10-CM | POA: Diagnosis not present

## 2021-09-21 DIAGNOSIS — J9601 Acute respiratory failure with hypoxia: Secondary | ICD-10-CM | POA: Diagnosis present

## 2021-09-21 DIAGNOSIS — M6281 Muscle weakness (generalized): Secondary | ICD-10-CM | POA: Diagnosis not present

## 2021-09-21 DIAGNOSIS — Z7409 Other reduced mobility: Secondary | ICD-10-CM | POA: Diagnosis present

## 2021-09-21 DIAGNOSIS — R531 Weakness: Secondary | ICD-10-CM | POA: Diagnosis not present

## 2021-09-21 DIAGNOSIS — U071 COVID-19: Secondary | ICD-10-CM | POA: Diagnosis not present

## 2021-09-21 DIAGNOSIS — R739 Hyperglycemia, unspecified: Secondary | ICD-10-CM | POA: Diagnosis not present

## 2021-09-21 DIAGNOSIS — J441 Chronic obstructive pulmonary disease with (acute) exacerbation: Secondary | ICD-10-CM | POA: Diagnosis present

## 2021-09-21 DIAGNOSIS — R269 Unspecified abnormalities of gait and mobility: Secondary | ICD-10-CM | POA: Diagnosis not present

## 2021-09-21 DIAGNOSIS — E559 Vitamin D deficiency, unspecified: Secondary | ICD-10-CM | POA: Diagnosis not present

## 2021-09-21 DIAGNOSIS — E876 Hypokalemia: Secondary | ICD-10-CM | POA: Diagnosis not present

## 2021-09-21 DIAGNOSIS — L89321 Pressure ulcer of left buttock, stage 1: Secondary | ICD-10-CM | POA: Diagnosis not present

## 2021-09-21 DIAGNOSIS — R0689 Other abnormalities of breathing: Secondary | ICD-10-CM | POA: Diagnosis not present

## 2021-09-21 DIAGNOSIS — G40909 Epilepsy, unspecified, not intractable, without status epilepticus: Secondary | ICD-10-CM | POA: Diagnosis not present

## 2021-09-21 DIAGNOSIS — Z23 Encounter for immunization: Secondary | ICD-10-CM | POA: Diagnosis not present

## 2021-09-21 DIAGNOSIS — Z8616 Personal history of COVID-19: Secondary | ICD-10-CM | POA: Diagnosis not present

## 2021-09-21 DIAGNOSIS — G40919 Epilepsy, unspecified, intractable, without status epilepticus: Secondary | ICD-10-CM | POA: Diagnosis not present

## 2021-09-21 DIAGNOSIS — F32A Depression, unspecified: Secondary | ICD-10-CM | POA: Diagnosis not present

## 2021-09-21 DIAGNOSIS — R5381 Other malaise: Secondary | ICD-10-CM | POA: Diagnosis not present

## 2021-09-21 DIAGNOSIS — I499 Cardiac arrhythmia, unspecified: Secondary | ICD-10-CM | POA: Diagnosis not present

## 2021-09-21 DIAGNOSIS — R7989 Other specified abnormal findings of blood chemistry: Secondary | ICD-10-CM | POA: Diagnosis present

## 2021-09-21 DIAGNOSIS — F0390 Unspecified dementia without behavioral disturbance: Secondary | ICD-10-CM | POA: Diagnosis not present

## 2021-09-21 DIAGNOSIS — Z66 Do not resuscitate: Secondary | ICD-10-CM | POA: Diagnosis present

## 2021-09-21 DIAGNOSIS — B9721 SARS-associated coronavirus as the cause of diseases classified elsewhere: Secondary | ICD-10-CM | POA: Diagnosis not present

## 2021-09-21 DIAGNOSIS — E1169 Type 2 diabetes mellitus with other specified complication: Secondary | ICD-10-CM | POA: Diagnosis not present

## 2021-09-21 DIAGNOSIS — D638 Anemia in other chronic diseases classified elsewhere: Secondary | ICD-10-CM | POA: Diagnosis not present

## 2021-09-21 DIAGNOSIS — E785 Hyperlipidemia, unspecified: Secondary | ICD-10-CM | POA: Diagnosis not present

## 2021-09-21 DIAGNOSIS — R270 Ataxia, unspecified: Secondary | ICD-10-CM | POA: Diagnosis not present

## 2021-09-21 DIAGNOSIS — R569 Unspecified convulsions: Secondary | ICD-10-CM | POA: Diagnosis not present

## 2021-09-21 DIAGNOSIS — R778 Other specified abnormalities of plasma proteins: Secondary | ICD-10-CM | POA: Diagnosis not present

## 2021-09-21 DIAGNOSIS — S0990XA Unspecified injury of head, initial encounter: Secondary | ICD-10-CM | POA: Diagnosis not present

## 2021-09-21 DIAGNOSIS — I248 Other forms of acute ischemic heart disease: Secondary | ICD-10-CM | POA: Diagnosis present

## 2021-09-21 DIAGNOSIS — Z79899 Other long term (current) drug therapy: Secondary | ICD-10-CM | POA: Diagnosis not present

## 2021-09-21 DIAGNOSIS — I10 Essential (primary) hypertension: Secondary | ICD-10-CM | POA: Diagnosis not present

## 2021-09-21 DIAGNOSIS — F4310 Post-traumatic stress disorder, unspecified: Secondary | ICD-10-CM | POA: Diagnosis not present

## 2021-09-21 DIAGNOSIS — Z743 Need for continuous supervision: Secondary | ICD-10-CM | POA: Diagnosis not present

## 2021-09-21 DIAGNOSIS — R1312 Dysphagia, oropharyngeal phase: Secondary | ICD-10-CM | POA: Diagnosis present

## 2021-09-21 DIAGNOSIS — E119 Type 2 diabetes mellitus without complications: Secondary | ICD-10-CM | POA: Diagnosis not present

## 2021-09-21 DIAGNOSIS — R197 Diarrhea, unspecified: Secondary | ICD-10-CM | POA: Diagnosis not present

## 2021-09-21 DIAGNOSIS — J9602 Acute respiratory failure with hypercapnia: Secondary | ICD-10-CM | POA: Diagnosis present

## 2021-09-22 DIAGNOSIS — R569 Unspecified convulsions: Secondary | ICD-10-CM | POA: Diagnosis not present

## 2021-09-22 DIAGNOSIS — G40909 Epilepsy, unspecified, not intractable, without status epilepticus: Secondary | ICD-10-CM | POA: Diagnosis not present

## 2021-09-22 DIAGNOSIS — E119 Type 2 diabetes mellitus without complications: Secondary | ICD-10-CM | POA: Diagnosis not present

## 2021-09-22 DIAGNOSIS — I10 Essential (primary) hypertension: Secondary | ICD-10-CM | POA: Diagnosis not present

## 2021-09-22 DIAGNOSIS — S0990XA Unspecified injury of head, initial encounter: Secondary | ICD-10-CM | POA: Diagnosis not present

## 2021-09-22 DIAGNOSIS — Z79899 Other long term (current) drug therapy: Secondary | ICD-10-CM | POA: Diagnosis not present

## 2021-09-22 DIAGNOSIS — Z8616 Personal history of COVID-19: Secondary | ICD-10-CM | POA: Diagnosis not present

## 2021-09-22 DIAGNOSIS — E876 Hypokalemia: Secondary | ICD-10-CM | POA: Diagnosis not present

## 2021-09-23 DIAGNOSIS — G40919 Epilepsy, unspecified, intractable, without status epilepticus: Secondary | ICD-10-CM | POA: Diagnosis not present

## 2021-09-23 DIAGNOSIS — I10 Essential (primary) hypertension: Secondary | ICD-10-CM | POA: Diagnosis not present

## 2021-09-23 DIAGNOSIS — B9721 SARS-associated coronavirus as the cause of diseases classified elsewhere: Secondary | ICD-10-CM | POA: Diagnosis not present

## 2021-09-23 DIAGNOSIS — F4310 Post-traumatic stress disorder, unspecified: Secondary | ICD-10-CM | POA: Diagnosis not present

## 2021-09-23 DIAGNOSIS — F32A Depression, unspecified: Secondary | ICD-10-CM | POA: Diagnosis not present

## 2021-09-23 DIAGNOSIS — M4850XD Collapsed vertebra, not elsewhere classified, site unspecified, subsequent encounter for fracture with routine healing: Secondary | ICD-10-CM | POA: Diagnosis not present

## 2021-09-23 DIAGNOSIS — F0390 Unspecified dementia without behavioral disturbance: Secondary | ICD-10-CM | POA: Diagnosis not present

## 2021-09-23 DIAGNOSIS — E1169 Type 2 diabetes mellitus with other specified complication: Secondary | ICD-10-CM | POA: Diagnosis not present

## 2021-09-24 DIAGNOSIS — M6281 Muscle weakness (generalized): Secondary | ICD-10-CM | POA: Diagnosis not present

## 2021-09-24 DIAGNOSIS — E1169 Type 2 diabetes mellitus with other specified complication: Secondary | ICD-10-CM | POA: Diagnosis not present

## 2021-09-24 DIAGNOSIS — I10 Essential (primary) hypertension: Secondary | ICD-10-CM | POA: Diagnosis not present

## 2021-09-24 DIAGNOSIS — L89321 Pressure ulcer of left buttock, stage 1: Secondary | ICD-10-CM | POA: Diagnosis not present

## 2021-09-25 ENCOUNTER — Telehealth: Payer: Self-pay | Admitting: General Practice

## 2021-09-25 DIAGNOSIS — E785 Hyperlipidemia, unspecified: Secondary | ICD-10-CM | POA: Diagnosis not present

## 2021-09-25 DIAGNOSIS — F0390 Unspecified dementia without behavioral disturbance: Secondary | ICD-10-CM | POA: Diagnosis not present

## 2021-09-25 DIAGNOSIS — F32A Depression, unspecified: Secondary | ICD-10-CM | POA: Diagnosis not present

## 2021-09-25 DIAGNOSIS — I10 Essential (primary) hypertension: Secondary | ICD-10-CM | POA: Diagnosis not present

## 2021-09-25 DIAGNOSIS — B9721 SARS-associated coronavirus as the cause of diseases classified elsewhere: Secondary | ICD-10-CM | POA: Diagnosis not present

## 2021-09-25 DIAGNOSIS — E1169 Type 2 diabetes mellitus with other specified complication: Secondary | ICD-10-CM | POA: Diagnosis not present

## 2021-09-25 DIAGNOSIS — F4310 Post-traumatic stress disorder, unspecified: Secondary | ICD-10-CM | POA: Diagnosis not present

## 2021-09-25 DIAGNOSIS — M4850XD Collapsed vertebra, not elsewhere classified, site unspecified, subsequent encounter for fracture with routine healing: Secondary | ICD-10-CM | POA: Diagnosis not present

## 2021-09-25 DIAGNOSIS — G40919 Epilepsy, unspecified, intractable, without status epilepticus: Secondary | ICD-10-CM | POA: Diagnosis not present

## 2021-09-25 NOTE — Telephone Encounter (Signed)
Transition Care Management Unsuccessful Follow-up Telephone Call  Date of discharge and from where:  09/23/21 from [facility]  Attempts:  1st Attempt  Reason for unsuccessful TCM follow-up call:  Left voice message

## 2021-09-27 NOTE — Telephone Encounter (Signed)
Transition Care Management Unsuccessful Follow-up Telephone Call  Date of discharge and from where:  09/23/21 from [facility]  Attempts:  2nd Attempt  Reason for unsuccessful TCM follow-up call:  Left voice message

## 2021-09-30 NOTE — Telephone Encounter (Signed)
Transition Care Management Unsuccessful Follow-up Telephone Call  Date of discharge and from where:  09/23/21 from [facility]  Attempts:  3rd Attempt  Reason for unsuccessful TCM follow-up call:  Unable to reach patient

## 2021-10-02 DIAGNOSIS — E1169 Type 2 diabetes mellitus with other specified complication: Secondary | ICD-10-CM | POA: Diagnosis not present

## 2021-10-02 DIAGNOSIS — M4850XD Collapsed vertebra, not elsewhere classified, site unspecified, subsequent encounter for fracture with routine healing: Secondary | ICD-10-CM | POA: Diagnosis not present

## 2021-10-02 DIAGNOSIS — F0390 Unspecified dementia without behavioral disturbance: Secondary | ICD-10-CM | POA: Diagnosis not present

## 2021-10-02 DIAGNOSIS — I10 Essential (primary) hypertension: Secondary | ICD-10-CM | POA: Diagnosis not present

## 2021-10-03 DIAGNOSIS — M6281 Muscle weakness (generalized): Secondary | ICD-10-CM | POA: Diagnosis not present

## 2021-10-03 DIAGNOSIS — E785 Hyperlipidemia, unspecified: Secondary | ICD-10-CM | POA: Diagnosis not present

## 2021-10-03 DIAGNOSIS — I10 Essential (primary) hypertension: Secondary | ICD-10-CM | POA: Diagnosis not present

## 2021-10-03 DIAGNOSIS — E559 Vitamin D deficiency, unspecified: Secondary | ICD-10-CM | POA: Diagnosis not present

## 2021-10-03 DIAGNOSIS — E876 Hypokalemia: Secondary | ICD-10-CM | POA: Diagnosis not present

## 2021-10-03 DIAGNOSIS — E119 Type 2 diabetes mellitus without complications: Secondary | ICD-10-CM | POA: Diagnosis not present

## 2021-10-03 DIAGNOSIS — E1169 Type 2 diabetes mellitus with other specified complication: Secondary | ICD-10-CM | POA: Diagnosis not present

## 2021-10-03 DIAGNOSIS — L89321 Pressure ulcer of left buttock, stage 1: Secondary | ICD-10-CM | POA: Diagnosis not present

## 2021-10-04 DIAGNOSIS — M4850XD Collapsed vertebra, not elsewhere classified, site unspecified, subsequent encounter for fracture with routine healing: Secondary | ICD-10-CM | POA: Diagnosis not present

## 2021-10-04 DIAGNOSIS — I10 Essential (primary) hypertension: Secondary | ICD-10-CM | POA: Diagnosis not present

## 2021-10-04 DIAGNOSIS — E1169 Type 2 diabetes mellitus with other specified complication: Secondary | ICD-10-CM | POA: Diagnosis not present

## 2021-10-04 DIAGNOSIS — R197 Diarrhea, unspecified: Secondary | ICD-10-CM | POA: Diagnosis not present

## 2021-10-09 DIAGNOSIS — I10 Essential (primary) hypertension: Secondary | ICD-10-CM | POA: Diagnosis not present

## 2021-10-09 DIAGNOSIS — R197 Diarrhea, unspecified: Secondary | ICD-10-CM | POA: Diagnosis not present

## 2021-10-09 DIAGNOSIS — E1169 Type 2 diabetes mellitus with other specified complication: Secondary | ICD-10-CM | POA: Diagnosis not present

## 2021-10-09 DIAGNOSIS — M4850XD Collapsed vertebra, not elsewhere classified, site unspecified, subsequent encounter for fracture with routine healing: Secondary | ICD-10-CM | POA: Diagnosis not present

## 2021-10-16 DIAGNOSIS — I10 Essential (primary) hypertension: Secondary | ICD-10-CM | POA: Diagnosis not present

## 2021-10-16 DIAGNOSIS — E1169 Type 2 diabetes mellitus with other specified complication: Secondary | ICD-10-CM | POA: Diagnosis not present

## 2021-10-16 DIAGNOSIS — D638 Anemia in other chronic diseases classified elsewhere: Secondary | ICD-10-CM | POA: Diagnosis not present

## 2021-10-18 DIAGNOSIS — E559 Vitamin D deficiency, unspecified: Secondary | ICD-10-CM | POA: Diagnosis not present

## 2021-10-18 DIAGNOSIS — R269 Unspecified abnormalities of gait and mobility: Secondary | ICD-10-CM | POA: Diagnosis not present

## 2021-10-18 DIAGNOSIS — E119 Type 2 diabetes mellitus without complications: Secondary | ICD-10-CM | POA: Diagnosis not present

## 2021-10-22 DIAGNOSIS — M4850XD Collapsed vertebra, not elsewhere classified, site unspecified, subsequent encounter for fracture with routine healing: Secondary | ICD-10-CM | POA: Diagnosis not present

## 2021-10-22 DIAGNOSIS — I10 Essential (primary) hypertension: Secondary | ICD-10-CM | POA: Diagnosis not present

## 2021-10-22 DIAGNOSIS — F0390 Unspecified dementia without behavioral disturbance: Secondary | ICD-10-CM | POA: Diagnosis not present

## 2021-10-22 DIAGNOSIS — G40919 Epilepsy, unspecified, intractable, without status epilepticus: Secondary | ICD-10-CM | POA: Diagnosis not present

## 2021-10-22 DIAGNOSIS — D638 Anemia in other chronic diseases classified elsewhere: Secondary | ICD-10-CM | POA: Diagnosis not present

## 2021-10-22 DIAGNOSIS — E1169 Type 2 diabetes mellitus with other specified complication: Secondary | ICD-10-CM | POA: Diagnosis not present

## 2021-10-22 DIAGNOSIS — F32A Depression, unspecified: Secondary | ICD-10-CM | POA: Diagnosis not present

## 2021-10-22 DIAGNOSIS — F4310 Post-traumatic stress disorder, unspecified: Secondary | ICD-10-CM | POA: Diagnosis not present

## 2021-10-23 DIAGNOSIS — D638 Anemia in other chronic diseases classified elsewhere: Secondary | ICD-10-CM | POA: Diagnosis not present

## 2021-10-23 DIAGNOSIS — F32A Depression, unspecified: Secondary | ICD-10-CM | POA: Diagnosis not present

## 2021-10-23 DIAGNOSIS — I10 Essential (primary) hypertension: Secondary | ICD-10-CM | POA: Diagnosis not present

## 2021-10-23 DIAGNOSIS — M4850XD Collapsed vertebra, not elsewhere classified, site unspecified, subsequent encounter for fracture with routine healing: Secondary | ICD-10-CM | POA: Diagnosis not present

## 2021-10-23 DIAGNOSIS — G40919 Epilepsy, unspecified, intractable, without status epilepticus: Secondary | ICD-10-CM | POA: Diagnosis not present

## 2021-10-23 DIAGNOSIS — F4310 Post-traumatic stress disorder, unspecified: Secondary | ICD-10-CM | POA: Diagnosis not present

## 2021-10-23 DIAGNOSIS — F0390 Unspecified dementia without behavioral disturbance: Secondary | ICD-10-CM | POA: Diagnosis not present

## 2021-10-23 DIAGNOSIS — E1169 Type 2 diabetes mellitus with other specified complication: Secondary | ICD-10-CM | POA: Diagnosis not present

## 2021-10-28 DIAGNOSIS — Z7982 Long term (current) use of aspirin: Secondary | ICD-10-CM | POA: Diagnosis not present

## 2021-10-28 DIAGNOSIS — M170 Bilateral primary osteoarthritis of knee: Secondary | ICD-10-CM | POA: Diagnosis not present

## 2021-10-28 DIAGNOSIS — Z9181 History of falling: Secondary | ICD-10-CM | POA: Diagnosis not present

## 2021-10-28 DIAGNOSIS — G40909 Epilepsy, unspecified, not intractable, without status epilepticus: Secondary | ICD-10-CM | POA: Diagnosis not present

## 2021-10-28 DIAGNOSIS — F0280 Dementia in other diseases classified elsewhere without behavioral disturbance: Secondary | ICD-10-CM | POA: Diagnosis not present

## 2021-10-28 DIAGNOSIS — E119 Type 2 diabetes mellitus without complications: Secondary | ICD-10-CM | POA: Diagnosis not present

## 2021-10-28 DIAGNOSIS — Z8616 Personal history of COVID-19: Secondary | ICD-10-CM | POA: Diagnosis not present

## 2021-10-28 DIAGNOSIS — I10 Essential (primary) hypertension: Secondary | ICD-10-CM | POA: Diagnosis not present

## 2021-10-28 DIAGNOSIS — M19042 Primary osteoarthritis, left hand: Secondary | ICD-10-CM | POA: Diagnosis not present

## 2021-10-28 DIAGNOSIS — M19041 Primary osteoarthritis, right hand: Secondary | ICD-10-CM | POA: Diagnosis not present

## 2021-10-29 DIAGNOSIS — F0280 Dementia in other diseases classified elsewhere without behavioral disturbance: Secondary | ICD-10-CM | POA: Diagnosis not present

## 2021-10-29 DIAGNOSIS — M170 Bilateral primary osteoarthritis of knee: Secondary | ICD-10-CM | POA: Diagnosis not present

## 2021-10-29 DIAGNOSIS — M19042 Primary osteoarthritis, left hand: Secondary | ICD-10-CM | POA: Diagnosis not present

## 2021-10-29 DIAGNOSIS — M19041 Primary osteoarthritis, right hand: Secondary | ICD-10-CM | POA: Diagnosis not present

## 2021-10-29 DIAGNOSIS — E119 Type 2 diabetes mellitus without complications: Secondary | ICD-10-CM | POA: Diagnosis not present

## 2021-10-29 DIAGNOSIS — I10 Essential (primary) hypertension: Secondary | ICD-10-CM | POA: Diagnosis not present

## 2021-10-30 DIAGNOSIS — M170 Bilateral primary osteoarthritis of knee: Secondary | ICD-10-CM | POA: Diagnosis not present

## 2021-10-30 DIAGNOSIS — M19042 Primary osteoarthritis, left hand: Secondary | ICD-10-CM | POA: Diagnosis not present

## 2021-10-30 DIAGNOSIS — E119 Type 2 diabetes mellitus without complications: Secondary | ICD-10-CM | POA: Diagnosis not present

## 2021-10-30 DIAGNOSIS — M19041 Primary osteoarthritis, right hand: Secondary | ICD-10-CM | POA: Diagnosis not present

## 2021-10-30 DIAGNOSIS — F0280 Dementia in other diseases classified elsewhere without behavioral disturbance: Secondary | ICD-10-CM | POA: Diagnosis not present

## 2021-10-30 DIAGNOSIS — I10 Essential (primary) hypertension: Secondary | ICD-10-CM | POA: Diagnosis not present

## 2021-10-31 DIAGNOSIS — M170 Bilateral primary osteoarthritis of knee: Secondary | ICD-10-CM | POA: Diagnosis not present

## 2021-10-31 DIAGNOSIS — M19041 Primary osteoarthritis, right hand: Secondary | ICD-10-CM | POA: Diagnosis not present

## 2021-10-31 DIAGNOSIS — E119 Type 2 diabetes mellitus without complications: Secondary | ICD-10-CM | POA: Diagnosis not present

## 2021-10-31 DIAGNOSIS — M19042 Primary osteoarthritis, left hand: Secondary | ICD-10-CM | POA: Diagnosis not present

## 2021-10-31 DIAGNOSIS — I10 Essential (primary) hypertension: Secondary | ICD-10-CM | POA: Diagnosis not present

## 2021-10-31 DIAGNOSIS — F0280 Dementia in other diseases classified elsewhere without behavioral disturbance: Secondary | ICD-10-CM | POA: Diagnosis not present

## 2021-11-01 DIAGNOSIS — E119 Type 2 diabetes mellitus without complications: Secondary | ICD-10-CM | POA: Diagnosis not present

## 2021-11-01 DIAGNOSIS — F0280 Dementia in other diseases classified elsewhere without behavioral disturbance: Secondary | ICD-10-CM | POA: Diagnosis not present

## 2021-11-01 DIAGNOSIS — M170 Bilateral primary osteoarthritis of knee: Secondary | ICD-10-CM | POA: Diagnosis not present

## 2021-11-01 DIAGNOSIS — M19042 Primary osteoarthritis, left hand: Secondary | ICD-10-CM | POA: Diagnosis not present

## 2021-11-01 DIAGNOSIS — I10 Essential (primary) hypertension: Secondary | ICD-10-CM | POA: Diagnosis not present

## 2021-11-01 DIAGNOSIS — M19041 Primary osteoarthritis, right hand: Secondary | ICD-10-CM | POA: Diagnosis not present

## 2021-11-05 DIAGNOSIS — I10 Essential (primary) hypertension: Secondary | ICD-10-CM | POA: Diagnosis not present

## 2021-11-05 DIAGNOSIS — M19041 Primary osteoarthritis, right hand: Secondary | ICD-10-CM | POA: Diagnosis not present

## 2021-11-05 DIAGNOSIS — E119 Type 2 diabetes mellitus without complications: Secondary | ICD-10-CM | POA: Diagnosis not present

## 2021-11-05 DIAGNOSIS — M170 Bilateral primary osteoarthritis of knee: Secondary | ICD-10-CM | POA: Diagnosis not present

## 2021-11-05 DIAGNOSIS — M19042 Primary osteoarthritis, left hand: Secondary | ICD-10-CM | POA: Diagnosis not present

## 2021-11-05 DIAGNOSIS — F0280 Dementia in other diseases classified elsewhere without behavioral disturbance: Secondary | ICD-10-CM | POA: Diagnosis not present

## 2021-11-06 DIAGNOSIS — I10 Essential (primary) hypertension: Secondary | ICD-10-CM | POA: Diagnosis not present

## 2021-11-06 DIAGNOSIS — E119 Type 2 diabetes mellitus without complications: Secondary | ICD-10-CM | POA: Diagnosis not present

## 2021-11-06 DIAGNOSIS — M19042 Primary osteoarthritis, left hand: Secondary | ICD-10-CM | POA: Diagnosis not present

## 2021-11-06 DIAGNOSIS — F0280 Dementia in other diseases classified elsewhere without behavioral disturbance: Secondary | ICD-10-CM | POA: Diagnosis not present

## 2021-11-06 DIAGNOSIS — M19041 Primary osteoarthritis, right hand: Secondary | ICD-10-CM | POA: Diagnosis not present

## 2021-11-06 DIAGNOSIS — M170 Bilateral primary osteoarthritis of knee: Secondary | ICD-10-CM | POA: Diagnosis not present

## 2021-11-07 DIAGNOSIS — M19042 Primary osteoarthritis, left hand: Secondary | ICD-10-CM | POA: Diagnosis not present

## 2021-11-07 DIAGNOSIS — M19041 Primary osteoarthritis, right hand: Secondary | ICD-10-CM | POA: Diagnosis not present

## 2021-11-07 DIAGNOSIS — E119 Type 2 diabetes mellitus without complications: Secondary | ICD-10-CM | POA: Diagnosis not present

## 2021-11-07 DIAGNOSIS — I10 Essential (primary) hypertension: Secondary | ICD-10-CM | POA: Diagnosis not present

## 2021-11-07 DIAGNOSIS — M170 Bilateral primary osteoarthritis of knee: Secondary | ICD-10-CM | POA: Diagnosis not present

## 2021-11-07 DIAGNOSIS — F0280 Dementia in other diseases classified elsewhere without behavioral disturbance: Secondary | ICD-10-CM | POA: Diagnosis not present

## 2021-11-12 DIAGNOSIS — I10 Essential (primary) hypertension: Secondary | ICD-10-CM | POA: Diagnosis not present

## 2021-11-12 DIAGNOSIS — E119 Type 2 diabetes mellitus without complications: Secondary | ICD-10-CM | POA: Diagnosis not present

## 2021-11-12 DIAGNOSIS — M19041 Primary osteoarthritis, right hand: Secondary | ICD-10-CM | POA: Diagnosis not present

## 2021-11-12 DIAGNOSIS — M19042 Primary osteoarthritis, left hand: Secondary | ICD-10-CM | POA: Diagnosis not present

## 2021-11-12 DIAGNOSIS — M170 Bilateral primary osteoarthritis of knee: Secondary | ICD-10-CM | POA: Diagnosis not present

## 2021-11-12 DIAGNOSIS — F0280 Dementia in other diseases classified elsewhere without behavioral disturbance: Secondary | ICD-10-CM | POA: Diagnosis not present

## 2021-11-14 DIAGNOSIS — M170 Bilateral primary osteoarthritis of knee: Secondary | ICD-10-CM | POA: Diagnosis not present

## 2021-11-14 DIAGNOSIS — E119 Type 2 diabetes mellitus without complications: Secondary | ICD-10-CM | POA: Diagnosis not present

## 2021-11-14 DIAGNOSIS — M19042 Primary osteoarthritis, left hand: Secondary | ICD-10-CM | POA: Diagnosis not present

## 2021-11-14 DIAGNOSIS — M19041 Primary osteoarthritis, right hand: Secondary | ICD-10-CM | POA: Diagnosis not present

## 2021-11-14 DIAGNOSIS — I10 Essential (primary) hypertension: Secondary | ICD-10-CM | POA: Diagnosis not present

## 2021-11-14 DIAGNOSIS — F0280 Dementia in other diseases classified elsewhere without behavioral disturbance: Secondary | ICD-10-CM | POA: Diagnosis not present

## 2021-11-18 DIAGNOSIS — M170 Bilateral primary osteoarthritis of knee: Secondary | ICD-10-CM | POA: Diagnosis not present

## 2021-11-18 DIAGNOSIS — M19041 Primary osteoarthritis, right hand: Secondary | ICD-10-CM | POA: Diagnosis not present

## 2021-11-18 DIAGNOSIS — I10 Essential (primary) hypertension: Secondary | ICD-10-CM | POA: Diagnosis not present

## 2021-11-18 DIAGNOSIS — M19042 Primary osteoarthritis, left hand: Secondary | ICD-10-CM | POA: Diagnosis not present

## 2021-11-18 DIAGNOSIS — E119 Type 2 diabetes mellitus without complications: Secondary | ICD-10-CM | POA: Diagnosis not present

## 2021-11-18 DIAGNOSIS — F0280 Dementia in other diseases classified elsewhere without behavioral disturbance: Secondary | ICD-10-CM | POA: Diagnosis not present

## 2021-11-20 DIAGNOSIS — M19042 Primary osteoarthritis, left hand: Secondary | ICD-10-CM | POA: Diagnosis not present

## 2021-11-20 DIAGNOSIS — M19041 Primary osteoarthritis, right hand: Secondary | ICD-10-CM | POA: Diagnosis not present

## 2021-11-20 DIAGNOSIS — E119 Type 2 diabetes mellitus without complications: Secondary | ICD-10-CM | POA: Diagnosis not present

## 2021-11-20 DIAGNOSIS — F0280 Dementia in other diseases classified elsewhere without behavioral disturbance: Secondary | ICD-10-CM | POA: Diagnosis not present

## 2021-11-20 DIAGNOSIS — M170 Bilateral primary osteoarthritis of knee: Secondary | ICD-10-CM | POA: Diagnosis not present

## 2021-11-20 DIAGNOSIS — I10 Essential (primary) hypertension: Secondary | ICD-10-CM | POA: Diagnosis not present

## 2021-11-25 DIAGNOSIS — I10 Essential (primary) hypertension: Secondary | ICD-10-CM | POA: Diagnosis not present

## 2021-11-25 DIAGNOSIS — E119 Type 2 diabetes mellitus without complications: Secondary | ICD-10-CM | POA: Diagnosis not present

## 2021-11-25 DIAGNOSIS — M19041 Primary osteoarthritis, right hand: Secondary | ICD-10-CM | POA: Diagnosis not present

## 2021-11-25 DIAGNOSIS — M19042 Primary osteoarthritis, left hand: Secondary | ICD-10-CM | POA: Diagnosis not present

## 2021-11-25 DIAGNOSIS — M170 Bilateral primary osteoarthritis of knee: Secondary | ICD-10-CM | POA: Diagnosis not present

## 2021-11-25 DIAGNOSIS — F0280 Dementia in other diseases classified elsewhere without behavioral disturbance: Secondary | ICD-10-CM | POA: Diagnosis not present

## 2021-11-26 DIAGNOSIS — F0280 Dementia in other diseases classified elsewhere without behavioral disturbance: Secondary | ICD-10-CM | POA: Diagnosis not present

## 2021-11-26 DIAGNOSIS — M170 Bilateral primary osteoarthritis of knee: Secondary | ICD-10-CM | POA: Diagnosis not present

## 2021-11-26 DIAGNOSIS — M19041 Primary osteoarthritis, right hand: Secondary | ICD-10-CM | POA: Diagnosis not present

## 2021-11-26 DIAGNOSIS — M19042 Primary osteoarthritis, left hand: Secondary | ICD-10-CM | POA: Diagnosis not present

## 2021-11-26 DIAGNOSIS — E119 Type 2 diabetes mellitus without complications: Secondary | ICD-10-CM | POA: Diagnosis not present

## 2021-11-26 DIAGNOSIS — I10 Essential (primary) hypertension: Secondary | ICD-10-CM | POA: Diagnosis not present

## 2021-11-27 DIAGNOSIS — Z9181 History of falling: Secondary | ICD-10-CM | POA: Diagnosis not present

## 2021-11-27 DIAGNOSIS — I10 Essential (primary) hypertension: Secondary | ICD-10-CM | POA: Diagnosis not present

## 2021-11-27 DIAGNOSIS — Z7982 Long term (current) use of aspirin: Secondary | ICD-10-CM | POA: Diagnosis not present

## 2021-11-27 DIAGNOSIS — G40909 Epilepsy, unspecified, not intractable, without status epilepticus: Secondary | ICD-10-CM | POA: Diagnosis not present

## 2021-11-27 DIAGNOSIS — M170 Bilateral primary osteoarthritis of knee: Secondary | ICD-10-CM | POA: Diagnosis not present

## 2021-11-27 DIAGNOSIS — F0280 Dementia in other diseases classified elsewhere without behavioral disturbance: Secondary | ICD-10-CM | POA: Diagnosis not present

## 2021-11-27 DIAGNOSIS — M19041 Primary osteoarthritis, right hand: Secondary | ICD-10-CM | POA: Diagnosis not present

## 2021-11-27 DIAGNOSIS — E119 Type 2 diabetes mellitus without complications: Secondary | ICD-10-CM | POA: Diagnosis not present

## 2021-11-27 DIAGNOSIS — M19042 Primary osteoarthritis, left hand: Secondary | ICD-10-CM | POA: Diagnosis not present

## 2021-11-27 DIAGNOSIS — Z8616 Personal history of COVID-19: Secondary | ICD-10-CM | POA: Diagnosis not present

## 2021-11-28 DIAGNOSIS — M170 Bilateral primary osteoarthritis of knee: Secondary | ICD-10-CM | POA: Diagnosis not present

## 2021-11-28 DIAGNOSIS — M19041 Primary osteoarthritis, right hand: Secondary | ICD-10-CM | POA: Diagnosis not present

## 2021-11-28 DIAGNOSIS — E119 Type 2 diabetes mellitus without complications: Secondary | ICD-10-CM | POA: Diagnosis not present

## 2021-11-28 DIAGNOSIS — M19042 Primary osteoarthritis, left hand: Secondary | ICD-10-CM | POA: Diagnosis not present

## 2021-11-28 DIAGNOSIS — I10 Essential (primary) hypertension: Secondary | ICD-10-CM | POA: Diagnosis not present

## 2021-11-28 DIAGNOSIS — F0280 Dementia in other diseases classified elsewhere without behavioral disturbance: Secondary | ICD-10-CM | POA: Diagnosis not present

## 2021-12-02 DIAGNOSIS — M19042 Primary osteoarthritis, left hand: Secondary | ICD-10-CM | POA: Diagnosis not present

## 2021-12-02 DIAGNOSIS — E119 Type 2 diabetes mellitus without complications: Secondary | ICD-10-CM | POA: Diagnosis not present

## 2021-12-02 DIAGNOSIS — M170 Bilateral primary osteoarthritis of knee: Secondary | ICD-10-CM | POA: Diagnosis not present

## 2021-12-02 DIAGNOSIS — M19041 Primary osteoarthritis, right hand: Secondary | ICD-10-CM | POA: Diagnosis not present

## 2021-12-02 DIAGNOSIS — I10 Essential (primary) hypertension: Secondary | ICD-10-CM | POA: Diagnosis not present

## 2021-12-02 DIAGNOSIS — F0280 Dementia in other diseases classified elsewhere without behavioral disturbance: Secondary | ICD-10-CM | POA: Diagnosis not present

## 2021-12-04 DIAGNOSIS — M19041 Primary osteoarthritis, right hand: Secondary | ICD-10-CM | POA: Diagnosis not present

## 2021-12-04 DIAGNOSIS — M19042 Primary osteoarthritis, left hand: Secondary | ICD-10-CM | POA: Diagnosis not present

## 2021-12-04 DIAGNOSIS — F0280 Dementia in other diseases classified elsewhere without behavioral disturbance: Secondary | ICD-10-CM | POA: Diagnosis not present

## 2021-12-04 DIAGNOSIS — M170 Bilateral primary osteoarthritis of knee: Secondary | ICD-10-CM | POA: Diagnosis not present

## 2021-12-04 DIAGNOSIS — E119 Type 2 diabetes mellitus without complications: Secondary | ICD-10-CM | POA: Diagnosis not present

## 2021-12-04 DIAGNOSIS — I10 Essential (primary) hypertension: Secondary | ICD-10-CM | POA: Diagnosis not present

## 2021-12-08 DIAGNOSIS — M19041 Primary osteoarthritis, right hand: Secondary | ICD-10-CM | POA: Diagnosis not present

## 2021-12-08 DIAGNOSIS — I10 Essential (primary) hypertension: Secondary | ICD-10-CM | POA: Diagnosis not present

## 2021-12-08 DIAGNOSIS — F0280 Dementia in other diseases classified elsewhere without behavioral disturbance: Secondary | ICD-10-CM | POA: Diagnosis not present

## 2021-12-08 DIAGNOSIS — M170 Bilateral primary osteoarthritis of knee: Secondary | ICD-10-CM | POA: Diagnosis not present

## 2021-12-08 DIAGNOSIS — M19042 Primary osteoarthritis, left hand: Secondary | ICD-10-CM | POA: Diagnosis not present

## 2021-12-08 DIAGNOSIS — E119 Type 2 diabetes mellitus without complications: Secondary | ICD-10-CM | POA: Diagnosis not present

## 2021-12-09 DIAGNOSIS — F0280 Dementia in other diseases classified elsewhere without behavioral disturbance: Secondary | ICD-10-CM | POA: Diagnosis not present

## 2021-12-09 DIAGNOSIS — M19042 Primary osteoarthritis, left hand: Secondary | ICD-10-CM | POA: Diagnosis not present

## 2021-12-09 DIAGNOSIS — M19041 Primary osteoarthritis, right hand: Secondary | ICD-10-CM | POA: Diagnosis not present

## 2021-12-09 DIAGNOSIS — M170 Bilateral primary osteoarthritis of knee: Secondary | ICD-10-CM | POA: Diagnosis not present

## 2021-12-09 DIAGNOSIS — E119 Type 2 diabetes mellitus without complications: Secondary | ICD-10-CM | POA: Diagnosis not present

## 2021-12-09 DIAGNOSIS — I10 Essential (primary) hypertension: Secondary | ICD-10-CM | POA: Diagnosis not present

## 2021-12-16 DIAGNOSIS — M170 Bilateral primary osteoarthritis of knee: Secondary | ICD-10-CM | POA: Diagnosis not present

## 2021-12-16 DIAGNOSIS — I10 Essential (primary) hypertension: Secondary | ICD-10-CM | POA: Diagnosis not present

## 2021-12-16 DIAGNOSIS — F0280 Dementia in other diseases classified elsewhere without behavioral disturbance: Secondary | ICD-10-CM | POA: Diagnosis not present

## 2021-12-16 DIAGNOSIS — E119 Type 2 diabetes mellitus without complications: Secondary | ICD-10-CM | POA: Diagnosis not present

## 2021-12-16 DIAGNOSIS — M19041 Primary osteoarthritis, right hand: Secondary | ICD-10-CM | POA: Diagnosis not present

## 2021-12-16 DIAGNOSIS — M19042 Primary osteoarthritis, left hand: Secondary | ICD-10-CM | POA: Diagnosis not present

## 2021-12-17 DIAGNOSIS — M19042 Primary osteoarthritis, left hand: Secondary | ICD-10-CM | POA: Diagnosis not present

## 2021-12-17 DIAGNOSIS — F0280 Dementia in other diseases classified elsewhere without behavioral disturbance: Secondary | ICD-10-CM | POA: Diagnosis not present

## 2021-12-17 DIAGNOSIS — M19041 Primary osteoarthritis, right hand: Secondary | ICD-10-CM | POA: Diagnosis not present

## 2021-12-17 DIAGNOSIS — E119 Type 2 diabetes mellitus without complications: Secondary | ICD-10-CM | POA: Diagnosis not present

## 2021-12-17 DIAGNOSIS — I10 Essential (primary) hypertension: Secondary | ICD-10-CM | POA: Diagnosis not present

## 2021-12-17 DIAGNOSIS — M170 Bilateral primary osteoarthritis of knee: Secondary | ICD-10-CM | POA: Diagnosis not present

## 2021-12-20 DIAGNOSIS — I10 Essential (primary) hypertension: Secondary | ICD-10-CM | POA: Diagnosis not present

## 2021-12-20 DIAGNOSIS — M170 Bilateral primary osteoarthritis of knee: Secondary | ICD-10-CM | POA: Diagnosis not present

## 2021-12-20 DIAGNOSIS — F0280 Dementia in other diseases classified elsewhere without behavioral disturbance: Secondary | ICD-10-CM | POA: Diagnosis not present

## 2021-12-20 DIAGNOSIS — E119 Type 2 diabetes mellitus without complications: Secondary | ICD-10-CM | POA: Diagnosis not present

## 2021-12-20 DIAGNOSIS — M19042 Primary osteoarthritis, left hand: Secondary | ICD-10-CM | POA: Diagnosis not present

## 2021-12-20 DIAGNOSIS — M19041 Primary osteoarthritis, right hand: Secondary | ICD-10-CM | POA: Diagnosis not present

## 2021-12-23 DIAGNOSIS — M19041 Primary osteoarthritis, right hand: Secondary | ICD-10-CM | POA: Diagnosis not present

## 2021-12-23 DIAGNOSIS — E119 Type 2 diabetes mellitus without complications: Secondary | ICD-10-CM | POA: Diagnosis not present

## 2021-12-23 DIAGNOSIS — M170 Bilateral primary osteoarthritis of knee: Secondary | ICD-10-CM | POA: Diagnosis not present

## 2021-12-23 DIAGNOSIS — F0280 Dementia in other diseases classified elsewhere without behavioral disturbance: Secondary | ICD-10-CM | POA: Diagnosis not present

## 2021-12-23 DIAGNOSIS — I10 Essential (primary) hypertension: Secondary | ICD-10-CM | POA: Diagnosis not present

## 2021-12-23 DIAGNOSIS — M19042 Primary osteoarthritis, left hand: Secondary | ICD-10-CM | POA: Diagnosis not present

## 2021-12-26 DIAGNOSIS — I10 Essential (primary) hypertension: Secondary | ICD-10-CM | POA: Diagnosis not present

## 2021-12-26 DIAGNOSIS — M19041 Primary osteoarthritis, right hand: Secondary | ICD-10-CM | POA: Diagnosis not present

## 2021-12-26 DIAGNOSIS — M170 Bilateral primary osteoarthritis of knee: Secondary | ICD-10-CM | POA: Diagnosis not present

## 2021-12-26 DIAGNOSIS — M19042 Primary osteoarthritis, left hand: Secondary | ICD-10-CM | POA: Diagnosis not present

## 2021-12-26 DIAGNOSIS — E119 Type 2 diabetes mellitus without complications: Secondary | ICD-10-CM | POA: Diagnosis not present

## 2021-12-26 DIAGNOSIS — F0280 Dementia in other diseases classified elsewhere without behavioral disturbance: Secondary | ICD-10-CM | POA: Diagnosis not present

## 2021-12-27 DIAGNOSIS — Z8781 Personal history of (healed) traumatic fracture: Secondary | ICD-10-CM | POA: Diagnosis not present

## 2021-12-27 DIAGNOSIS — I10 Essential (primary) hypertension: Secondary | ICD-10-CM | POA: Diagnosis not present

## 2021-12-27 DIAGNOSIS — E119 Type 2 diabetes mellitus without complications: Secondary | ICD-10-CM | POA: Diagnosis not present

## 2021-12-27 DIAGNOSIS — M19041 Primary osteoarthritis, right hand: Secondary | ICD-10-CM | POA: Diagnosis not present

## 2021-12-27 DIAGNOSIS — U099 Post covid-19 condition, unspecified: Secondary | ICD-10-CM | POA: Diagnosis not present

## 2021-12-27 DIAGNOSIS — Z9181 History of falling: Secondary | ICD-10-CM | POA: Diagnosis not present

## 2021-12-27 DIAGNOSIS — G40909 Epilepsy, unspecified, not intractable, without status epilepticus: Secondary | ICD-10-CM | POA: Diagnosis not present

## 2021-12-27 DIAGNOSIS — M170 Bilateral primary osteoarthritis of knee: Secondary | ICD-10-CM | POA: Diagnosis not present

## 2021-12-27 DIAGNOSIS — F0280 Dementia in other diseases classified elsewhere without behavioral disturbance: Secondary | ICD-10-CM | POA: Diagnosis not present

## 2021-12-27 DIAGNOSIS — F4310 Post-traumatic stress disorder, unspecified: Secondary | ICD-10-CM | POA: Diagnosis not present

## 2021-12-27 DIAGNOSIS — Z7982 Long term (current) use of aspirin: Secondary | ICD-10-CM | POA: Diagnosis not present

## 2021-12-27 DIAGNOSIS — R531 Weakness: Secondary | ICD-10-CM | POA: Diagnosis not present

## 2021-12-27 DIAGNOSIS — M19042 Primary osteoarthritis, left hand: Secondary | ICD-10-CM | POA: Diagnosis not present

## 2021-12-30 DIAGNOSIS — G40909 Epilepsy, unspecified, not intractable, without status epilepticus: Secondary | ICD-10-CM | POA: Diagnosis not present

## 2021-12-30 DIAGNOSIS — R531 Weakness: Secondary | ICD-10-CM | POA: Diagnosis not present

## 2021-12-30 DIAGNOSIS — U099 Post covid-19 condition, unspecified: Secondary | ICD-10-CM | POA: Diagnosis not present

## 2021-12-30 DIAGNOSIS — I10 Essential (primary) hypertension: Secondary | ICD-10-CM | POA: Diagnosis not present

## 2021-12-30 DIAGNOSIS — E119 Type 2 diabetes mellitus without complications: Secondary | ICD-10-CM | POA: Diagnosis not present

## 2021-12-30 DIAGNOSIS — F0280 Dementia in other diseases classified elsewhere without behavioral disturbance: Secondary | ICD-10-CM | POA: Diagnosis not present

## 2021-12-31 DIAGNOSIS — R531 Weakness: Secondary | ICD-10-CM | POA: Diagnosis not present

## 2021-12-31 DIAGNOSIS — F0280 Dementia in other diseases classified elsewhere without behavioral disturbance: Secondary | ICD-10-CM | POA: Diagnosis not present

## 2021-12-31 DIAGNOSIS — E119 Type 2 diabetes mellitus without complications: Secondary | ICD-10-CM | POA: Diagnosis not present

## 2021-12-31 DIAGNOSIS — I10 Essential (primary) hypertension: Secondary | ICD-10-CM | POA: Diagnosis not present

## 2021-12-31 DIAGNOSIS — U099 Post covid-19 condition, unspecified: Secondary | ICD-10-CM | POA: Diagnosis not present

## 2021-12-31 DIAGNOSIS — G40909 Epilepsy, unspecified, not intractable, without status epilepticus: Secondary | ICD-10-CM | POA: Diagnosis not present

## 2022-01-07 DIAGNOSIS — E119 Type 2 diabetes mellitus without complications: Secondary | ICD-10-CM | POA: Diagnosis not present

## 2022-01-07 DIAGNOSIS — F0280 Dementia in other diseases classified elsewhere without behavioral disturbance: Secondary | ICD-10-CM | POA: Diagnosis not present

## 2022-01-07 DIAGNOSIS — R531 Weakness: Secondary | ICD-10-CM | POA: Diagnosis not present

## 2022-01-07 DIAGNOSIS — G40909 Epilepsy, unspecified, not intractable, without status epilepticus: Secondary | ICD-10-CM | POA: Diagnosis not present

## 2022-01-07 DIAGNOSIS — I10 Essential (primary) hypertension: Secondary | ICD-10-CM | POA: Diagnosis not present

## 2022-01-07 DIAGNOSIS — U099 Post covid-19 condition, unspecified: Secondary | ICD-10-CM | POA: Diagnosis not present

## 2022-01-08 DIAGNOSIS — R531 Weakness: Secondary | ICD-10-CM | POA: Diagnosis not present

## 2022-01-08 DIAGNOSIS — F0280 Dementia in other diseases classified elsewhere without behavioral disturbance: Secondary | ICD-10-CM | POA: Diagnosis not present

## 2022-01-08 DIAGNOSIS — U099 Post covid-19 condition, unspecified: Secondary | ICD-10-CM | POA: Diagnosis not present

## 2022-01-08 DIAGNOSIS — I10 Essential (primary) hypertension: Secondary | ICD-10-CM | POA: Diagnosis not present

## 2022-01-08 DIAGNOSIS — E119 Type 2 diabetes mellitus without complications: Secondary | ICD-10-CM | POA: Diagnosis not present

## 2022-01-08 DIAGNOSIS — G40909 Epilepsy, unspecified, not intractable, without status epilepticus: Secondary | ICD-10-CM | POA: Diagnosis not present

## 2022-01-09 DIAGNOSIS — I10 Essential (primary) hypertension: Secondary | ICD-10-CM | POA: Diagnosis not present

## 2022-01-09 DIAGNOSIS — E119 Type 2 diabetes mellitus without complications: Secondary | ICD-10-CM | POA: Diagnosis not present

## 2022-01-09 DIAGNOSIS — F0280 Dementia in other diseases classified elsewhere without behavioral disturbance: Secondary | ICD-10-CM | POA: Diagnosis not present

## 2022-01-09 DIAGNOSIS — G40909 Epilepsy, unspecified, not intractable, without status epilepticus: Secondary | ICD-10-CM | POA: Diagnosis not present

## 2022-01-09 DIAGNOSIS — R531 Weakness: Secondary | ICD-10-CM | POA: Diagnosis not present

## 2022-01-09 DIAGNOSIS — U099 Post covid-19 condition, unspecified: Secondary | ICD-10-CM | POA: Diagnosis not present

## 2022-01-15 DIAGNOSIS — G40909 Epilepsy, unspecified, not intractable, without status epilepticus: Secondary | ICD-10-CM | POA: Diagnosis not present

## 2022-01-15 DIAGNOSIS — F0280 Dementia in other diseases classified elsewhere without behavioral disturbance: Secondary | ICD-10-CM | POA: Diagnosis not present

## 2022-01-15 DIAGNOSIS — R531 Weakness: Secondary | ICD-10-CM | POA: Diagnosis not present

## 2022-01-15 DIAGNOSIS — U099 Post covid-19 condition, unspecified: Secondary | ICD-10-CM | POA: Diagnosis not present

## 2022-01-15 DIAGNOSIS — I10 Essential (primary) hypertension: Secondary | ICD-10-CM | POA: Diagnosis not present

## 2022-01-15 DIAGNOSIS — E119 Type 2 diabetes mellitus without complications: Secondary | ICD-10-CM | POA: Diagnosis not present

## 2022-01-16 DIAGNOSIS — U099 Post covid-19 condition, unspecified: Secondary | ICD-10-CM | POA: Diagnosis not present

## 2022-01-16 DIAGNOSIS — I10 Essential (primary) hypertension: Secondary | ICD-10-CM | POA: Diagnosis not present

## 2022-01-16 DIAGNOSIS — E119 Type 2 diabetes mellitus without complications: Secondary | ICD-10-CM | POA: Diagnosis not present

## 2022-01-16 DIAGNOSIS — G40909 Epilepsy, unspecified, not intractable, without status epilepticus: Secondary | ICD-10-CM | POA: Diagnosis not present

## 2022-01-16 DIAGNOSIS — F0280 Dementia in other diseases classified elsewhere without behavioral disturbance: Secondary | ICD-10-CM | POA: Diagnosis not present

## 2022-01-16 DIAGNOSIS — R531 Weakness: Secondary | ICD-10-CM | POA: Diagnosis not present

## 2022-01-20 DIAGNOSIS — F0280 Dementia in other diseases classified elsewhere without behavioral disturbance: Secondary | ICD-10-CM | POA: Diagnosis not present

## 2022-01-20 DIAGNOSIS — E119 Type 2 diabetes mellitus without complications: Secondary | ICD-10-CM | POA: Diagnosis not present

## 2022-01-20 DIAGNOSIS — U099 Post covid-19 condition, unspecified: Secondary | ICD-10-CM | POA: Diagnosis not present

## 2022-01-20 DIAGNOSIS — I10 Essential (primary) hypertension: Secondary | ICD-10-CM | POA: Diagnosis not present

## 2022-01-20 DIAGNOSIS — R531 Weakness: Secondary | ICD-10-CM | POA: Diagnosis not present

## 2022-01-20 DIAGNOSIS — G40909 Epilepsy, unspecified, not intractable, without status epilepticus: Secondary | ICD-10-CM | POA: Diagnosis not present

## 2022-01-23 DIAGNOSIS — E119 Type 2 diabetes mellitus without complications: Secondary | ICD-10-CM | POA: Diagnosis not present

## 2022-01-23 DIAGNOSIS — F0280 Dementia in other diseases classified elsewhere without behavioral disturbance: Secondary | ICD-10-CM | POA: Diagnosis not present

## 2022-01-23 DIAGNOSIS — G40909 Epilepsy, unspecified, not intractable, without status epilepticus: Secondary | ICD-10-CM | POA: Diagnosis not present

## 2022-01-23 DIAGNOSIS — U099 Post covid-19 condition, unspecified: Secondary | ICD-10-CM | POA: Diagnosis not present

## 2022-01-23 DIAGNOSIS — I10 Essential (primary) hypertension: Secondary | ICD-10-CM | POA: Diagnosis not present

## 2022-01-23 DIAGNOSIS — R531 Weakness: Secondary | ICD-10-CM | POA: Diagnosis not present

## 2022-01-24 DIAGNOSIS — F0280 Dementia in other diseases classified elsewhere without behavioral disturbance: Secondary | ICD-10-CM | POA: Diagnosis not present

## 2022-01-24 DIAGNOSIS — G40909 Epilepsy, unspecified, not intractable, without status epilepticus: Secondary | ICD-10-CM | POA: Diagnosis not present

## 2022-01-24 DIAGNOSIS — I10 Essential (primary) hypertension: Secondary | ICD-10-CM | POA: Diagnosis not present

## 2022-01-24 DIAGNOSIS — U099 Post covid-19 condition, unspecified: Secondary | ICD-10-CM | POA: Diagnosis not present

## 2022-01-24 DIAGNOSIS — E119 Type 2 diabetes mellitus without complications: Secondary | ICD-10-CM | POA: Diagnosis not present

## 2022-01-24 DIAGNOSIS — R531 Weakness: Secondary | ICD-10-CM | POA: Diagnosis not present

## 2022-01-26 DIAGNOSIS — M19041 Primary osteoarthritis, right hand: Secondary | ICD-10-CM | POA: Diagnosis not present

## 2022-01-26 DIAGNOSIS — Z8781 Personal history of (healed) traumatic fracture: Secondary | ICD-10-CM | POA: Diagnosis not present

## 2022-01-26 DIAGNOSIS — Z7982 Long term (current) use of aspirin: Secondary | ICD-10-CM | POA: Diagnosis not present

## 2022-01-26 DIAGNOSIS — E119 Type 2 diabetes mellitus without complications: Secondary | ICD-10-CM | POA: Diagnosis not present

## 2022-01-26 DIAGNOSIS — F0280 Dementia in other diseases classified elsewhere without behavioral disturbance: Secondary | ICD-10-CM | POA: Diagnosis not present

## 2022-01-26 DIAGNOSIS — F4310 Post-traumatic stress disorder, unspecified: Secondary | ICD-10-CM | POA: Diagnosis not present

## 2022-01-26 DIAGNOSIS — U099 Post covid-19 condition, unspecified: Secondary | ICD-10-CM | POA: Diagnosis not present

## 2022-01-26 DIAGNOSIS — I10 Essential (primary) hypertension: Secondary | ICD-10-CM | POA: Diagnosis not present

## 2022-01-26 DIAGNOSIS — G40909 Epilepsy, unspecified, not intractable, without status epilepticus: Secondary | ICD-10-CM | POA: Diagnosis not present

## 2022-01-26 DIAGNOSIS — M19042 Primary osteoarthritis, left hand: Secondary | ICD-10-CM | POA: Diagnosis not present

## 2022-01-26 DIAGNOSIS — M170 Bilateral primary osteoarthritis of knee: Secondary | ICD-10-CM | POA: Diagnosis not present

## 2022-01-26 DIAGNOSIS — Z9181 History of falling: Secondary | ICD-10-CM | POA: Diagnosis not present

## 2022-01-26 DIAGNOSIS — R531 Weakness: Secondary | ICD-10-CM | POA: Diagnosis not present

## 2022-01-27 DIAGNOSIS — G40909 Epilepsy, unspecified, not intractable, without status epilepticus: Secondary | ICD-10-CM | POA: Diagnosis not present

## 2022-01-27 DIAGNOSIS — I10 Essential (primary) hypertension: Secondary | ICD-10-CM | POA: Diagnosis not present

## 2022-01-27 DIAGNOSIS — U099 Post covid-19 condition, unspecified: Secondary | ICD-10-CM | POA: Diagnosis not present

## 2022-01-27 DIAGNOSIS — F0280 Dementia in other diseases classified elsewhere without behavioral disturbance: Secondary | ICD-10-CM | POA: Diagnosis not present

## 2022-01-27 DIAGNOSIS — E119 Type 2 diabetes mellitus without complications: Secondary | ICD-10-CM | POA: Diagnosis not present

## 2022-01-27 DIAGNOSIS — R531 Weakness: Secondary | ICD-10-CM | POA: Diagnosis not present

## 2022-01-28 DIAGNOSIS — U099 Post covid-19 condition, unspecified: Secondary | ICD-10-CM | POA: Diagnosis not present

## 2022-01-28 DIAGNOSIS — E119 Type 2 diabetes mellitus without complications: Secondary | ICD-10-CM | POA: Diagnosis not present

## 2022-01-28 DIAGNOSIS — F0280 Dementia in other diseases classified elsewhere without behavioral disturbance: Secondary | ICD-10-CM | POA: Diagnosis not present

## 2022-01-28 DIAGNOSIS — G40909 Epilepsy, unspecified, not intractable, without status epilepticus: Secondary | ICD-10-CM | POA: Diagnosis not present

## 2022-01-28 DIAGNOSIS — R531 Weakness: Secondary | ICD-10-CM | POA: Diagnosis not present

## 2022-01-28 DIAGNOSIS — I10 Essential (primary) hypertension: Secondary | ICD-10-CM | POA: Diagnosis not present

## 2022-02-05 DIAGNOSIS — I10 Essential (primary) hypertension: Secondary | ICD-10-CM | POA: Diagnosis not present

## 2022-02-05 DIAGNOSIS — U099 Post covid-19 condition, unspecified: Secondary | ICD-10-CM | POA: Diagnosis not present

## 2022-02-05 DIAGNOSIS — R531 Weakness: Secondary | ICD-10-CM | POA: Diagnosis not present

## 2022-02-05 DIAGNOSIS — F0280 Dementia in other diseases classified elsewhere without behavioral disturbance: Secondary | ICD-10-CM | POA: Diagnosis not present

## 2022-02-05 DIAGNOSIS — G40909 Epilepsy, unspecified, not intractable, without status epilepticus: Secondary | ICD-10-CM | POA: Diagnosis not present

## 2022-02-05 DIAGNOSIS — E119 Type 2 diabetes mellitus without complications: Secondary | ICD-10-CM | POA: Diagnosis not present

## 2022-02-06 DIAGNOSIS — F0280 Dementia in other diseases classified elsewhere without behavioral disturbance: Secondary | ICD-10-CM | POA: Diagnosis not present

## 2022-02-06 DIAGNOSIS — U099 Post covid-19 condition, unspecified: Secondary | ICD-10-CM | POA: Diagnosis not present

## 2022-02-06 DIAGNOSIS — I10 Essential (primary) hypertension: Secondary | ICD-10-CM | POA: Diagnosis not present

## 2022-02-06 DIAGNOSIS — R531 Weakness: Secondary | ICD-10-CM | POA: Diagnosis not present

## 2022-02-06 DIAGNOSIS — E119 Type 2 diabetes mellitus without complications: Secondary | ICD-10-CM | POA: Diagnosis not present

## 2022-02-06 DIAGNOSIS — G40909 Epilepsy, unspecified, not intractable, without status epilepticus: Secondary | ICD-10-CM | POA: Diagnosis not present

## 2022-02-11 DIAGNOSIS — R531 Weakness: Secondary | ICD-10-CM | POA: Diagnosis not present

## 2022-02-11 DIAGNOSIS — G40909 Epilepsy, unspecified, not intractable, without status epilepticus: Secondary | ICD-10-CM | POA: Diagnosis not present

## 2022-02-11 DIAGNOSIS — I10 Essential (primary) hypertension: Secondary | ICD-10-CM | POA: Diagnosis not present

## 2022-02-11 DIAGNOSIS — E119 Type 2 diabetes mellitus without complications: Secondary | ICD-10-CM | POA: Diagnosis not present

## 2022-02-11 DIAGNOSIS — U099 Post covid-19 condition, unspecified: Secondary | ICD-10-CM | POA: Diagnosis not present

## 2022-02-11 DIAGNOSIS — F0280 Dementia in other diseases classified elsewhere without behavioral disturbance: Secondary | ICD-10-CM | POA: Diagnosis not present

## 2022-02-19 DIAGNOSIS — R531 Weakness: Secondary | ICD-10-CM | POA: Diagnosis not present

## 2022-02-19 DIAGNOSIS — E119 Type 2 diabetes mellitus without complications: Secondary | ICD-10-CM | POA: Diagnosis not present

## 2022-02-19 DIAGNOSIS — I10 Essential (primary) hypertension: Secondary | ICD-10-CM | POA: Diagnosis not present

## 2022-02-19 DIAGNOSIS — U099 Post covid-19 condition, unspecified: Secondary | ICD-10-CM | POA: Diagnosis not present

## 2022-02-19 DIAGNOSIS — F0280 Dementia in other diseases classified elsewhere without behavioral disturbance: Secondary | ICD-10-CM | POA: Diagnosis not present

## 2022-02-19 DIAGNOSIS — G40909 Epilepsy, unspecified, not intractable, without status epilepticus: Secondary | ICD-10-CM | POA: Diagnosis not present

## 2022-02-20 DIAGNOSIS — R531 Weakness: Secondary | ICD-10-CM | POA: Diagnosis not present

## 2022-02-20 DIAGNOSIS — U099 Post covid-19 condition, unspecified: Secondary | ICD-10-CM | POA: Diagnosis not present

## 2022-02-20 DIAGNOSIS — I10 Essential (primary) hypertension: Secondary | ICD-10-CM | POA: Diagnosis not present

## 2022-02-20 DIAGNOSIS — E119 Type 2 diabetes mellitus without complications: Secondary | ICD-10-CM | POA: Diagnosis not present

## 2022-02-20 DIAGNOSIS — F0280 Dementia in other diseases classified elsewhere without behavioral disturbance: Secondary | ICD-10-CM | POA: Diagnosis not present

## 2022-02-20 DIAGNOSIS — G40909 Epilepsy, unspecified, not intractable, without status epilepticus: Secondary | ICD-10-CM | POA: Diagnosis not present

## 2022-02-25 DIAGNOSIS — M19041 Primary osteoarthritis, right hand: Secondary | ICD-10-CM | POA: Diagnosis not present

## 2022-02-25 DIAGNOSIS — Z8616 Personal history of COVID-19: Secondary | ICD-10-CM | POA: Diagnosis not present

## 2022-02-25 DIAGNOSIS — I10 Essential (primary) hypertension: Secondary | ICD-10-CM | POA: Diagnosis not present

## 2022-02-25 DIAGNOSIS — Z7982 Long term (current) use of aspirin: Secondary | ICD-10-CM | POA: Diagnosis not present

## 2022-02-25 DIAGNOSIS — E119 Type 2 diabetes mellitus without complications: Secondary | ICD-10-CM | POA: Diagnosis not present

## 2022-02-25 DIAGNOSIS — M170 Bilateral primary osteoarthritis of knee: Secondary | ICD-10-CM | POA: Diagnosis not present

## 2022-02-25 DIAGNOSIS — F0390 Unspecified dementia without behavioral disturbance: Secondary | ICD-10-CM | POA: Diagnosis not present

## 2022-02-25 DIAGNOSIS — M19042 Primary osteoarthritis, left hand: Secondary | ICD-10-CM | POA: Diagnosis not present

## 2022-02-25 DIAGNOSIS — Z9181 History of falling: Secondary | ICD-10-CM | POA: Diagnosis not present

## 2022-02-25 DIAGNOSIS — G40909 Epilepsy, unspecified, not intractable, without status epilepticus: Secondary | ICD-10-CM | POA: Diagnosis not present

## 2022-03-12 DIAGNOSIS — E119 Type 2 diabetes mellitus without complications: Secondary | ICD-10-CM | POA: Diagnosis not present

## 2022-03-12 DIAGNOSIS — M170 Bilateral primary osteoarthritis of knee: Secondary | ICD-10-CM | POA: Diagnosis not present

## 2022-03-12 DIAGNOSIS — I10 Essential (primary) hypertension: Secondary | ICD-10-CM | POA: Diagnosis not present

## 2022-03-12 DIAGNOSIS — F0390 Unspecified dementia without behavioral disturbance: Secondary | ICD-10-CM | POA: Diagnosis not present

## 2022-03-12 DIAGNOSIS — M19042 Primary osteoarthritis, left hand: Secondary | ICD-10-CM | POA: Diagnosis not present

## 2022-03-12 DIAGNOSIS — M19041 Primary osteoarthritis, right hand: Secondary | ICD-10-CM | POA: Diagnosis not present

## 2022-03-13 DIAGNOSIS — M170 Bilateral primary osteoarthritis of knee: Secondary | ICD-10-CM | POA: Diagnosis not present

## 2022-03-13 DIAGNOSIS — M19042 Primary osteoarthritis, left hand: Secondary | ICD-10-CM | POA: Diagnosis not present

## 2022-03-13 DIAGNOSIS — F0390 Unspecified dementia without behavioral disturbance: Secondary | ICD-10-CM | POA: Diagnosis not present

## 2022-03-13 DIAGNOSIS — I10 Essential (primary) hypertension: Secondary | ICD-10-CM | POA: Diagnosis not present

## 2022-03-13 DIAGNOSIS — M19041 Primary osteoarthritis, right hand: Secondary | ICD-10-CM | POA: Diagnosis not present

## 2022-03-13 DIAGNOSIS — E119 Type 2 diabetes mellitus without complications: Secondary | ICD-10-CM | POA: Diagnosis not present

## 2022-03-18 DIAGNOSIS — E119 Type 2 diabetes mellitus without complications: Secondary | ICD-10-CM | POA: Diagnosis not present

## 2022-03-18 DIAGNOSIS — M170 Bilateral primary osteoarthritis of knee: Secondary | ICD-10-CM | POA: Diagnosis not present

## 2022-03-18 DIAGNOSIS — M19042 Primary osteoarthritis, left hand: Secondary | ICD-10-CM | POA: Diagnosis not present

## 2022-03-18 DIAGNOSIS — F0390 Unspecified dementia without behavioral disturbance: Secondary | ICD-10-CM | POA: Diagnosis not present

## 2022-03-18 DIAGNOSIS — M19041 Primary osteoarthritis, right hand: Secondary | ICD-10-CM | POA: Diagnosis not present

## 2022-03-18 DIAGNOSIS — I10 Essential (primary) hypertension: Secondary | ICD-10-CM | POA: Diagnosis not present

## 2022-03-19 DIAGNOSIS — I10 Essential (primary) hypertension: Secondary | ICD-10-CM | POA: Diagnosis not present

## 2022-03-19 DIAGNOSIS — M19042 Primary osteoarthritis, left hand: Secondary | ICD-10-CM | POA: Diagnosis not present

## 2022-03-19 DIAGNOSIS — M170 Bilateral primary osteoarthritis of knee: Secondary | ICD-10-CM | POA: Diagnosis not present

## 2022-03-19 DIAGNOSIS — E119 Type 2 diabetes mellitus without complications: Secondary | ICD-10-CM | POA: Diagnosis not present

## 2022-03-19 DIAGNOSIS — M19041 Primary osteoarthritis, right hand: Secondary | ICD-10-CM | POA: Diagnosis not present

## 2022-03-19 DIAGNOSIS — F0390 Unspecified dementia without behavioral disturbance: Secondary | ICD-10-CM | POA: Diagnosis not present

## 2022-03-20 DIAGNOSIS — F0390 Unspecified dementia without behavioral disturbance: Secondary | ICD-10-CM | POA: Diagnosis not present

## 2022-03-20 DIAGNOSIS — I10 Essential (primary) hypertension: Secondary | ICD-10-CM | POA: Diagnosis not present

## 2022-03-20 DIAGNOSIS — M19042 Primary osteoarthritis, left hand: Secondary | ICD-10-CM | POA: Diagnosis not present

## 2022-03-20 DIAGNOSIS — M19041 Primary osteoarthritis, right hand: Secondary | ICD-10-CM | POA: Diagnosis not present

## 2022-03-20 DIAGNOSIS — M170 Bilateral primary osteoarthritis of knee: Secondary | ICD-10-CM | POA: Diagnosis not present

## 2022-03-20 DIAGNOSIS — E119 Type 2 diabetes mellitus without complications: Secondary | ICD-10-CM | POA: Diagnosis not present

## 2022-03-25 DIAGNOSIS — I10 Essential (primary) hypertension: Secondary | ICD-10-CM | POA: Diagnosis not present

## 2022-03-25 DIAGNOSIS — M19042 Primary osteoarthritis, left hand: Secondary | ICD-10-CM | POA: Diagnosis not present

## 2022-03-25 DIAGNOSIS — E119 Type 2 diabetes mellitus without complications: Secondary | ICD-10-CM | POA: Diagnosis not present

## 2022-03-25 DIAGNOSIS — F0390 Unspecified dementia without behavioral disturbance: Secondary | ICD-10-CM | POA: Diagnosis not present

## 2022-03-25 DIAGNOSIS — M170 Bilateral primary osteoarthritis of knee: Secondary | ICD-10-CM | POA: Diagnosis not present

## 2022-03-25 DIAGNOSIS — M19041 Primary osteoarthritis, right hand: Secondary | ICD-10-CM | POA: Diagnosis not present

## 2022-03-27 DIAGNOSIS — M19042 Primary osteoarthritis, left hand: Secondary | ICD-10-CM | POA: Diagnosis not present

## 2022-03-27 DIAGNOSIS — G40909 Epilepsy, unspecified, not intractable, without status epilepticus: Secondary | ICD-10-CM | POA: Diagnosis not present

## 2022-03-27 DIAGNOSIS — Z7982 Long term (current) use of aspirin: Secondary | ICD-10-CM | POA: Diagnosis not present

## 2022-03-27 DIAGNOSIS — M19041 Primary osteoarthritis, right hand: Secondary | ICD-10-CM | POA: Diagnosis not present

## 2022-03-27 DIAGNOSIS — E119 Type 2 diabetes mellitus without complications: Secondary | ICD-10-CM | POA: Diagnosis not present

## 2022-03-27 DIAGNOSIS — Z9181 History of falling: Secondary | ICD-10-CM | POA: Diagnosis not present

## 2022-03-27 DIAGNOSIS — F0390 Unspecified dementia without behavioral disturbance: Secondary | ICD-10-CM | POA: Diagnosis not present

## 2022-03-27 DIAGNOSIS — Z8616 Personal history of COVID-19: Secondary | ICD-10-CM | POA: Diagnosis not present

## 2022-03-27 DIAGNOSIS — I10 Essential (primary) hypertension: Secondary | ICD-10-CM | POA: Diagnosis not present

## 2022-03-27 DIAGNOSIS — M170 Bilateral primary osteoarthritis of knee: Secondary | ICD-10-CM | POA: Diagnosis not present

## 2022-04-04 DIAGNOSIS — F0390 Unspecified dementia without behavioral disturbance: Secondary | ICD-10-CM | POA: Diagnosis not present

## 2022-04-04 DIAGNOSIS — M19042 Primary osteoarthritis, left hand: Secondary | ICD-10-CM | POA: Diagnosis not present

## 2022-04-04 DIAGNOSIS — E119 Type 2 diabetes mellitus without complications: Secondary | ICD-10-CM | POA: Diagnosis not present

## 2022-04-04 DIAGNOSIS — M170 Bilateral primary osteoarthritis of knee: Secondary | ICD-10-CM | POA: Diagnosis not present

## 2022-04-04 DIAGNOSIS — I10 Essential (primary) hypertension: Secondary | ICD-10-CM | POA: Diagnosis not present

## 2022-04-04 DIAGNOSIS — M19041 Primary osteoarthritis, right hand: Secondary | ICD-10-CM | POA: Diagnosis not present

## 2022-04-05 DIAGNOSIS — Z9182 Personal history of military deployment: Secondary | ICD-10-CM | POA: Diagnosis not present

## 2022-04-05 DIAGNOSIS — M19041 Primary osteoarthritis, right hand: Secondary | ICD-10-CM | POA: Diagnosis not present

## 2022-04-05 DIAGNOSIS — F4310 Post-traumatic stress disorder, unspecified: Secondary | ICD-10-CM | POA: Diagnosis not present

## 2022-04-05 DIAGNOSIS — M170 Bilateral primary osteoarthritis of knee: Secondary | ICD-10-CM | POA: Diagnosis not present

## 2022-04-05 DIAGNOSIS — I10 Essential (primary) hypertension: Secondary | ICD-10-CM | POA: Diagnosis not present

## 2022-04-05 DIAGNOSIS — R2689 Other abnormalities of gait and mobility: Secondary | ICD-10-CM | POA: Diagnosis not present

## 2022-04-05 DIAGNOSIS — Z8616 Personal history of COVID-19: Secondary | ICD-10-CM | POA: Diagnosis not present

## 2022-04-05 DIAGNOSIS — G40909 Epilepsy, unspecified, not intractable, without status epilepticus: Secondary | ICD-10-CM | POA: Diagnosis not present

## 2022-04-05 DIAGNOSIS — E119 Type 2 diabetes mellitus without complications: Secondary | ICD-10-CM | POA: Diagnosis not present

## 2022-04-05 DIAGNOSIS — Z9181 History of falling: Secondary | ICD-10-CM | POA: Diagnosis not present

## 2022-04-05 DIAGNOSIS — Z7982 Long term (current) use of aspirin: Secondary | ICD-10-CM | POA: Diagnosis not present

## 2022-04-05 DIAGNOSIS — F0390 Unspecified dementia without behavioral disturbance: Secondary | ICD-10-CM | POA: Diagnosis not present

## 2022-04-05 DIAGNOSIS — M19042 Primary osteoarthritis, left hand: Secondary | ICD-10-CM | POA: Diagnosis not present

## 2022-04-07 DIAGNOSIS — I10 Essential (primary) hypertension: Secondary | ICD-10-CM | POA: Diagnosis not present

## 2022-04-07 DIAGNOSIS — Z9181 History of falling: Secondary | ICD-10-CM | POA: Diagnosis not present

## 2022-04-07 DIAGNOSIS — M170 Bilateral primary osteoarthritis of knee: Secondary | ICD-10-CM | POA: Diagnosis not present

## 2022-04-07 DIAGNOSIS — Z7982 Long term (current) use of aspirin: Secondary | ICD-10-CM | POA: Diagnosis not present

## 2022-04-07 DIAGNOSIS — M19042 Primary osteoarthritis, left hand: Secondary | ICD-10-CM | POA: Diagnosis not present

## 2022-04-07 DIAGNOSIS — F0390 Unspecified dementia without behavioral disturbance: Secondary | ICD-10-CM | POA: Diagnosis not present

## 2022-04-07 DIAGNOSIS — Z8616 Personal history of COVID-19: Secondary | ICD-10-CM | POA: Diagnosis not present

## 2022-04-07 DIAGNOSIS — G40909 Epilepsy, unspecified, not intractable, without status epilepticus: Secondary | ICD-10-CM | POA: Diagnosis not present

## 2022-04-07 DIAGNOSIS — E119 Type 2 diabetes mellitus without complications: Secondary | ICD-10-CM | POA: Diagnosis not present

## 2022-04-07 DIAGNOSIS — M19041 Primary osteoarthritis, right hand: Secondary | ICD-10-CM | POA: Diagnosis not present

## 2022-04-08 DIAGNOSIS — M19041 Primary osteoarthritis, right hand: Secondary | ICD-10-CM | POA: Diagnosis not present

## 2022-04-08 DIAGNOSIS — E119 Type 2 diabetes mellitus without complications: Secondary | ICD-10-CM | POA: Diagnosis not present

## 2022-04-08 DIAGNOSIS — F0390 Unspecified dementia without behavioral disturbance: Secondary | ICD-10-CM | POA: Diagnosis not present

## 2022-04-08 DIAGNOSIS — M19042 Primary osteoarthritis, left hand: Secondary | ICD-10-CM | POA: Diagnosis not present

## 2022-04-08 DIAGNOSIS — I10 Essential (primary) hypertension: Secondary | ICD-10-CM | POA: Diagnosis not present

## 2022-04-08 DIAGNOSIS — M170 Bilateral primary osteoarthritis of knee: Secondary | ICD-10-CM | POA: Diagnosis not present

## 2022-04-15 DIAGNOSIS — M170 Bilateral primary osteoarthritis of knee: Secondary | ICD-10-CM | POA: Diagnosis not present

## 2022-04-15 DIAGNOSIS — F0390 Unspecified dementia without behavioral disturbance: Secondary | ICD-10-CM | POA: Diagnosis not present

## 2022-04-15 DIAGNOSIS — I10 Essential (primary) hypertension: Secondary | ICD-10-CM | POA: Diagnosis not present

## 2022-04-15 DIAGNOSIS — M19041 Primary osteoarthritis, right hand: Secondary | ICD-10-CM | POA: Diagnosis not present

## 2022-04-15 DIAGNOSIS — E119 Type 2 diabetes mellitus without complications: Secondary | ICD-10-CM | POA: Diagnosis not present

## 2022-04-15 DIAGNOSIS — M19042 Primary osteoarthritis, left hand: Secondary | ICD-10-CM | POA: Diagnosis not present

## 2022-04-26 DIAGNOSIS — M19042 Primary osteoarthritis, left hand: Secondary | ICD-10-CM | POA: Diagnosis not present

## 2022-04-26 DIAGNOSIS — G40909 Epilepsy, unspecified, not intractable, without status epilepticus: Secondary | ICD-10-CM | POA: Diagnosis not present

## 2022-04-26 DIAGNOSIS — Z9181 History of falling: Secondary | ICD-10-CM | POA: Diagnosis not present

## 2022-04-26 DIAGNOSIS — M19041 Primary osteoarthritis, right hand: Secondary | ICD-10-CM | POA: Diagnosis not present

## 2022-04-26 DIAGNOSIS — I10 Essential (primary) hypertension: Secondary | ICD-10-CM | POA: Diagnosis not present

## 2022-04-26 DIAGNOSIS — Z7982 Long term (current) use of aspirin: Secondary | ICD-10-CM | POA: Diagnosis not present

## 2022-04-26 DIAGNOSIS — Z8616 Personal history of COVID-19: Secondary | ICD-10-CM | POA: Diagnosis not present

## 2022-04-26 DIAGNOSIS — F0390 Unspecified dementia without behavioral disturbance: Secondary | ICD-10-CM | POA: Diagnosis not present

## 2022-04-26 DIAGNOSIS — R2689 Other abnormalities of gait and mobility: Secondary | ICD-10-CM | POA: Diagnosis not present

## 2022-04-26 DIAGNOSIS — E119 Type 2 diabetes mellitus without complications: Secondary | ICD-10-CM | POA: Diagnosis not present

## 2022-04-26 DIAGNOSIS — F4310 Post-traumatic stress disorder, unspecified: Secondary | ICD-10-CM | POA: Diagnosis not present

## 2022-04-28 DIAGNOSIS — M19041 Primary osteoarthritis, right hand: Secondary | ICD-10-CM | POA: Diagnosis not present

## 2022-04-28 DIAGNOSIS — F0390 Unspecified dementia without behavioral disturbance: Secondary | ICD-10-CM | POA: Diagnosis not present

## 2022-04-28 DIAGNOSIS — I10 Essential (primary) hypertension: Secondary | ICD-10-CM | POA: Diagnosis not present

## 2022-04-28 DIAGNOSIS — M19042 Primary osteoarthritis, left hand: Secondary | ICD-10-CM | POA: Diagnosis not present

## 2022-04-28 DIAGNOSIS — F4310 Post-traumatic stress disorder, unspecified: Secondary | ICD-10-CM | POA: Diagnosis not present

## 2022-04-28 DIAGNOSIS — E119 Type 2 diabetes mellitus without complications: Secondary | ICD-10-CM | POA: Diagnosis not present

## 2022-04-30 DIAGNOSIS — E119 Type 2 diabetes mellitus without complications: Secondary | ICD-10-CM | POA: Diagnosis not present

## 2022-04-30 DIAGNOSIS — M19042 Primary osteoarthritis, left hand: Secondary | ICD-10-CM | POA: Diagnosis not present

## 2022-04-30 DIAGNOSIS — I10 Essential (primary) hypertension: Secondary | ICD-10-CM | POA: Diagnosis not present

## 2022-04-30 DIAGNOSIS — F4310 Post-traumatic stress disorder, unspecified: Secondary | ICD-10-CM | POA: Diagnosis not present

## 2022-04-30 DIAGNOSIS — F0390 Unspecified dementia without behavioral disturbance: Secondary | ICD-10-CM | POA: Diagnosis not present

## 2022-04-30 DIAGNOSIS — M19041 Primary osteoarthritis, right hand: Secondary | ICD-10-CM | POA: Diagnosis not present

## 2022-05-05 DIAGNOSIS — M19041 Primary osteoarthritis, right hand: Secondary | ICD-10-CM | POA: Diagnosis not present

## 2022-05-05 DIAGNOSIS — F0390 Unspecified dementia without behavioral disturbance: Secondary | ICD-10-CM | POA: Diagnosis not present

## 2022-05-05 DIAGNOSIS — E119 Type 2 diabetes mellitus without complications: Secondary | ICD-10-CM | POA: Diagnosis not present

## 2022-05-05 DIAGNOSIS — I10 Essential (primary) hypertension: Secondary | ICD-10-CM | POA: Diagnosis not present

## 2022-05-05 DIAGNOSIS — F4310 Post-traumatic stress disorder, unspecified: Secondary | ICD-10-CM | POA: Diagnosis not present

## 2022-05-05 DIAGNOSIS — M19042 Primary osteoarthritis, left hand: Secondary | ICD-10-CM | POA: Diagnosis not present

## 2022-05-07 ENCOUNTER — Ambulatory Visit: Payer: Self-pay | Admitting: *Deleted

## 2022-05-07 ENCOUNTER — Encounter: Payer: Self-pay | Admitting: *Deleted

## 2022-05-07 NOTE — Patient Outreach (Signed)
  Care Coordination   05/07/2022 Name: Dale Lowe MRN: [id number] DOB: [birth year]   Care Coordination Outreach Attempts:  An unsuccessful telephone outreach was attempted today to offer the patient information about available care coordination services as a benefit of their health plan.   Follow Up Plan:  Additional outreach attempts will be made to offer the patient care coordination information and services.   Encounter Outcome:  No Answer phone range without physical or voice mail pick up  Care Coordination Interventions Activated:  No   Care Coordination Interventions:  No, not indicated Unsuccessful attempt # 1    Oneta Rack, RN, BSN, CCRN Alumnus RN CM Care Coordination/ Transition of [place] [facility] [phone]: direct office

## 2022-05-09 ENCOUNTER — Encounter: Payer: Self-pay | Admitting: *Deleted

## 2022-05-09 ENCOUNTER — Ambulatory Visit: Payer: Self-pay | Admitting: *Deleted

## 2022-05-09 NOTE — Patient Outreach (Signed)
  Care Coordination   05/09/2022 Name: Dale Lowe MRN: [id number] DOB: [birth year]   Care Coordination Outreach Attempts:  A second unsuccessful outreach was attempted today to offer the patient with information about available care coordination services as a benefit of their health plan.     Follow Up Plan:  Additional outreach attempts will be made to offer the patient care coordination information and services.   Encounter Outcome:  No Answer phone rang without physical or voice mail pick up; unable to leave voice message requesting call back  Care Coordination Interventions Activated:  No   Care Coordination Interventions:  No, not indicated unsuccessful outreach attempt # 2   Oneta Rack, RN, BSN, CCRN Alumnus RN CM Care Coordination/ Transition of [place] [facility] [phone]: direct office

## 2022-05-12 DIAGNOSIS — M19041 Primary osteoarthritis, right hand: Secondary | ICD-10-CM | POA: Diagnosis not present

## 2022-05-12 DIAGNOSIS — E119 Type 2 diabetes mellitus without complications: Secondary | ICD-10-CM | POA: Diagnosis not present

## 2022-05-12 DIAGNOSIS — F0390 Unspecified dementia without behavioral disturbance: Secondary | ICD-10-CM | POA: Diagnosis not present

## 2022-05-12 DIAGNOSIS — M19042 Primary osteoarthritis, left hand: Secondary | ICD-10-CM | POA: Diagnosis not present

## 2022-05-12 DIAGNOSIS — F4310 Post-traumatic stress disorder, unspecified: Secondary | ICD-10-CM | POA: Diagnosis not present

## 2022-05-12 DIAGNOSIS — I10 Essential (primary) hypertension: Secondary | ICD-10-CM | POA: Diagnosis not present

## 2022-05-15 ENCOUNTER — Encounter: Payer: Self-pay | Admitting: *Deleted

## 2022-05-15 ENCOUNTER — Telehealth: Payer: Self-pay | Admitting: *Deleted

## 2022-05-15 DIAGNOSIS — F4310 Post-traumatic stress disorder, unspecified: Secondary | ICD-10-CM | POA: Diagnosis not present

## 2022-05-15 DIAGNOSIS — E119 Type 2 diabetes mellitus without complications: Secondary | ICD-10-CM | POA: Diagnosis not present

## 2022-05-15 DIAGNOSIS — M19042 Primary osteoarthritis, left hand: Secondary | ICD-10-CM | POA: Diagnosis not present

## 2022-05-15 DIAGNOSIS — M19041 Primary osteoarthritis, right hand: Secondary | ICD-10-CM | POA: Diagnosis not present

## 2022-05-15 DIAGNOSIS — I10 Essential (primary) hypertension: Secondary | ICD-10-CM | POA: Diagnosis not present

## 2022-05-15 DIAGNOSIS — F0390 Unspecified dementia without behavioral disturbance: Secondary | ICD-10-CM | POA: Diagnosis not present

## 2022-05-15 NOTE — Patient Outreach (Signed)
  Care Coordination   05/15/2022 Name: Gibril Mastro MRN: [id number] DOB: June 07, 1946   Care Coordination Outreach Attempts:  A third unsuccessful outreach was attempted today to offer the patient with information about available care coordination services as a benefit of their health plan.   Follow Up Plan:  No further outreach attempts will be made at this time. We have been unable to contact the patient to offer or enroll patient in care coordination services  Encounter Outcome:  No Answer- Received automated outgoing voice message stating "your call cannot be completed at this time, please hang up and try your call again another time;" no option to leave voice message requesting call back   Care Coordination Interventions Activated:  No   Care Coordination Interventions:  No, not indicated unsuccessful outreach attempt # [place], RN, BSN, CCRN Alumnus RN CM Care Coordination/ Transition of [place] [facility] [phone]: direct office

## 2022-05-19 DIAGNOSIS — F0390 Unspecified dementia without behavioral disturbance: Secondary | ICD-10-CM | POA: Diagnosis not present

## 2022-05-19 DIAGNOSIS — M19041 Primary osteoarthritis, right hand: Secondary | ICD-10-CM | POA: Diagnosis not present

## 2022-05-19 DIAGNOSIS — F4310 Post-traumatic stress disorder, unspecified: Secondary | ICD-10-CM | POA: Diagnosis not present

## 2022-05-19 DIAGNOSIS — M19042 Primary osteoarthritis, left hand: Secondary | ICD-10-CM | POA: Diagnosis not present

## 2022-05-19 DIAGNOSIS — I10 Essential (primary) hypertension: Secondary | ICD-10-CM | POA: Diagnosis not present

## 2022-05-19 DIAGNOSIS — E119 Type 2 diabetes mellitus without complications: Secondary | ICD-10-CM | POA: Diagnosis not present

## 2022-05-26 DIAGNOSIS — Z9181 History of falling: Secondary | ICD-10-CM | POA: Diagnosis not present

## 2022-05-26 DIAGNOSIS — F0390 Unspecified dementia without behavioral disturbance: Secondary | ICD-10-CM | POA: Diagnosis not present

## 2022-05-26 DIAGNOSIS — I10 Essential (primary) hypertension: Secondary | ICD-10-CM | POA: Diagnosis not present

## 2022-05-26 DIAGNOSIS — G40909 Epilepsy, unspecified, not intractable, without status epilepticus: Secondary | ICD-10-CM | POA: Diagnosis not present

## 2022-05-26 DIAGNOSIS — E119 Type 2 diabetes mellitus without complications: Secondary | ICD-10-CM | POA: Diagnosis not present

## 2022-05-26 DIAGNOSIS — R2689 Other abnormalities of gait and mobility: Secondary | ICD-10-CM | POA: Diagnosis not present

## 2022-05-26 DIAGNOSIS — M19041 Primary osteoarthritis, right hand: Secondary | ICD-10-CM | POA: Diagnosis not present

## 2022-05-26 DIAGNOSIS — Z8616 Personal history of COVID-19: Secondary | ICD-10-CM | POA: Diagnosis not present

## 2022-05-26 DIAGNOSIS — M19042 Primary osteoarthritis, left hand: Secondary | ICD-10-CM | POA: Diagnosis not present

## 2022-05-26 DIAGNOSIS — F4310 Post-traumatic stress disorder, unspecified: Secondary | ICD-10-CM | POA: Diagnosis not present

## 2022-05-26 DIAGNOSIS — Z7982 Long term (current) use of aspirin: Secondary | ICD-10-CM | POA: Diagnosis not present

## 2022-05-27 DIAGNOSIS — M19042 Primary osteoarthritis, left hand: Secondary | ICD-10-CM | POA: Diagnosis not present

## 2022-05-27 DIAGNOSIS — I10 Essential (primary) hypertension: Secondary | ICD-10-CM | POA: Diagnosis not present

## 2022-05-27 DIAGNOSIS — F4310 Post-traumatic stress disorder, unspecified: Secondary | ICD-10-CM | POA: Diagnosis not present

## 2022-05-27 DIAGNOSIS — F0390 Unspecified dementia without behavioral disturbance: Secondary | ICD-10-CM | POA: Diagnosis not present

## 2022-05-27 DIAGNOSIS — M19041 Primary osteoarthritis, right hand: Secondary | ICD-10-CM | POA: Diagnosis not present

## 2022-05-27 DIAGNOSIS — E119 Type 2 diabetes mellitus without complications: Secondary | ICD-10-CM | POA: Diagnosis not present

## 2022-05-28 DIAGNOSIS — I10 Essential (primary) hypertension: Secondary | ICD-10-CM | POA: Diagnosis not present

## 2022-05-28 DIAGNOSIS — F0390 Unspecified dementia without behavioral disturbance: Secondary | ICD-10-CM | POA: Diagnosis not present

## 2022-05-28 DIAGNOSIS — F4310 Post-traumatic stress disorder, unspecified: Secondary | ICD-10-CM | POA: Diagnosis not present

## 2022-05-28 DIAGNOSIS — M19041 Primary osteoarthritis, right hand: Secondary | ICD-10-CM | POA: Diagnosis not present

## 2022-05-28 DIAGNOSIS — M19042 Primary osteoarthritis, left hand: Secondary | ICD-10-CM | POA: Diagnosis not present

## 2022-05-28 DIAGNOSIS — E119 Type 2 diabetes mellitus without complications: Secondary | ICD-10-CM | POA: Diagnosis not present

## 2022-06-11 DIAGNOSIS — F4310 Post-traumatic stress disorder, unspecified: Secondary | ICD-10-CM | POA: Diagnosis not present

## 2022-06-11 DIAGNOSIS — F0390 Unspecified dementia without behavioral disturbance: Secondary | ICD-10-CM | POA: Diagnosis not present

## 2022-06-11 DIAGNOSIS — E119 Type 2 diabetes mellitus without complications: Secondary | ICD-10-CM | POA: Diagnosis not present

## 2022-06-11 DIAGNOSIS — M19042 Primary osteoarthritis, left hand: Secondary | ICD-10-CM | POA: Diagnosis not present

## 2022-06-11 DIAGNOSIS — I10 Essential (primary) hypertension: Secondary | ICD-10-CM | POA: Diagnosis not present

## 2022-06-11 DIAGNOSIS — M19041 Primary osteoarthritis, right hand: Secondary | ICD-10-CM | POA: Diagnosis not present

## 2022-06-24 DIAGNOSIS — E119 Type 2 diabetes mellitus without complications: Secondary | ICD-10-CM | POA: Diagnosis not present

## 2022-06-24 DIAGNOSIS — M19041 Primary osteoarthritis, right hand: Secondary | ICD-10-CM | POA: Diagnosis not present

## 2022-06-24 DIAGNOSIS — M19042 Primary osteoarthritis, left hand: Secondary | ICD-10-CM | POA: Diagnosis not present

## 2022-06-24 DIAGNOSIS — I10 Essential (primary) hypertension: Secondary | ICD-10-CM | POA: Diagnosis not present

## 2022-06-24 DIAGNOSIS — F0390 Unspecified dementia without behavioral disturbance: Secondary | ICD-10-CM | POA: Diagnosis not present

## 2022-06-24 DIAGNOSIS — F4310 Post-traumatic stress disorder, unspecified: Secondary | ICD-10-CM | POA: Diagnosis not present

## 2022-06-25 DIAGNOSIS — Z7982 Long term (current) use of aspirin: Secondary | ICD-10-CM | POA: Diagnosis not present

## 2022-06-25 DIAGNOSIS — G40909 Epilepsy, unspecified, not intractable, without status epilepticus: Secondary | ICD-10-CM | POA: Diagnosis not present

## 2022-06-25 DIAGNOSIS — F4310 Post-traumatic stress disorder, unspecified: Secondary | ICD-10-CM | POA: Diagnosis not present

## 2022-06-25 DIAGNOSIS — I10 Essential (primary) hypertension: Secondary | ICD-10-CM | POA: Diagnosis not present

## 2022-06-25 DIAGNOSIS — Z9181 History of falling: Secondary | ICD-10-CM | POA: Diagnosis not present

## 2022-06-25 DIAGNOSIS — E119 Type 2 diabetes mellitus without complications: Secondary | ICD-10-CM | POA: Diagnosis not present

## 2022-06-25 DIAGNOSIS — M19041 Primary osteoarthritis, right hand: Secondary | ICD-10-CM | POA: Diagnosis not present

## 2022-06-25 DIAGNOSIS — Z8616 Personal history of COVID-19: Secondary | ICD-10-CM | POA: Diagnosis not present

## 2022-06-25 DIAGNOSIS — M19042 Primary osteoarthritis, left hand: Secondary | ICD-10-CM | POA: Diagnosis not present

## 2022-06-25 DIAGNOSIS — M170 Bilateral primary osteoarthritis of knee: Secondary | ICD-10-CM | POA: Diagnosis not present

## 2022-06-25 DIAGNOSIS — F0280 Dementia in other diseases classified elsewhere without behavioral disturbance: Secondary | ICD-10-CM | POA: Diagnosis not present

## 2022-06-30 DIAGNOSIS — M19042 Primary osteoarthritis, left hand: Secondary | ICD-10-CM | POA: Diagnosis not present

## 2022-06-30 DIAGNOSIS — F0280 Dementia in other diseases classified elsewhere without behavioral disturbance: Secondary | ICD-10-CM | POA: Diagnosis not present

## 2022-06-30 DIAGNOSIS — I10 Essential (primary) hypertension: Secondary | ICD-10-CM | POA: Diagnosis not present

## 2022-06-30 DIAGNOSIS — M19041 Primary osteoarthritis, right hand: Secondary | ICD-10-CM | POA: Diagnosis not present

## 2022-06-30 DIAGNOSIS — E119 Type 2 diabetes mellitus without complications: Secondary | ICD-10-CM | POA: Diagnosis not present

## 2022-06-30 DIAGNOSIS — G40909 Epilepsy, unspecified, not intractable, without status epilepticus: Secondary | ICD-10-CM | POA: Diagnosis not present

## 2022-07-07 DIAGNOSIS — I10 Essential (primary) hypertension: Secondary | ICD-10-CM | POA: Diagnosis not present

## 2022-07-07 DIAGNOSIS — E119 Type 2 diabetes mellitus without complications: Secondary | ICD-10-CM | POA: Diagnosis not present

## 2022-07-07 DIAGNOSIS — F0280 Dementia in other diseases classified elsewhere without behavioral disturbance: Secondary | ICD-10-CM | POA: Diagnosis not present

## 2022-07-07 DIAGNOSIS — G40909 Epilepsy, unspecified, not intractable, without status epilepticus: Secondary | ICD-10-CM | POA: Diagnosis not present

## 2022-07-07 DIAGNOSIS — M19041 Primary osteoarthritis, right hand: Secondary | ICD-10-CM | POA: Diagnosis not present

## 2022-07-07 DIAGNOSIS — M19042 Primary osteoarthritis, left hand: Secondary | ICD-10-CM | POA: Diagnosis not present

## 2022-07-11 DIAGNOSIS — M19041 Primary osteoarthritis, right hand: Secondary | ICD-10-CM | POA: Diagnosis not present

## 2022-07-11 DIAGNOSIS — E119 Type 2 diabetes mellitus without complications: Secondary | ICD-10-CM | POA: Diagnosis not present

## 2022-07-11 DIAGNOSIS — F0280 Dementia in other diseases classified elsewhere without behavioral disturbance: Secondary | ICD-10-CM | POA: Diagnosis not present

## 2022-07-11 DIAGNOSIS — G40909 Epilepsy, unspecified, not intractable, without status epilepticus: Secondary | ICD-10-CM | POA: Diagnosis not present

## 2022-07-11 DIAGNOSIS — M19042 Primary osteoarthritis, left hand: Secondary | ICD-10-CM | POA: Diagnosis not present

## 2022-07-11 DIAGNOSIS — I10 Essential (primary) hypertension: Secondary | ICD-10-CM | POA: Diagnosis not present

## 2022-07-16 DIAGNOSIS — I10 Essential (primary) hypertension: Secondary | ICD-10-CM | POA: Diagnosis not present

## 2022-07-16 DIAGNOSIS — M19042 Primary osteoarthritis, left hand: Secondary | ICD-10-CM | POA: Diagnosis not present

## 2022-07-16 DIAGNOSIS — E119 Type 2 diabetes mellitus without complications: Secondary | ICD-10-CM | POA: Diagnosis not present

## 2022-07-16 DIAGNOSIS — G40909 Epilepsy, unspecified, not intractable, without status epilepticus: Secondary | ICD-10-CM | POA: Diagnosis not present

## 2022-07-16 DIAGNOSIS — F0280 Dementia in other diseases classified elsewhere without behavioral disturbance: Secondary | ICD-10-CM | POA: Diagnosis not present

## 2022-07-16 DIAGNOSIS — M19041 Primary osteoarthritis, right hand: Secondary | ICD-10-CM | POA: Diagnosis not present

## 2022-07-22 DIAGNOSIS — M19042 Primary osteoarthritis, left hand: Secondary | ICD-10-CM | POA: Diagnosis not present

## 2022-07-22 DIAGNOSIS — F0280 Dementia in other diseases classified elsewhere without behavioral disturbance: Secondary | ICD-10-CM | POA: Diagnosis not present

## 2022-07-22 DIAGNOSIS — E119 Type 2 diabetes mellitus without complications: Secondary | ICD-10-CM | POA: Diagnosis not present

## 2022-07-22 DIAGNOSIS — I10 Essential (primary) hypertension: Secondary | ICD-10-CM | POA: Diagnosis not present

## 2022-07-22 DIAGNOSIS — G40909 Epilepsy, unspecified, not intractable, without status epilepticus: Secondary | ICD-10-CM | POA: Diagnosis not present

## 2022-07-22 DIAGNOSIS — M19041 Primary osteoarthritis, right hand: Secondary | ICD-10-CM | POA: Diagnosis not present

## 2022-07-23 DIAGNOSIS — E119 Type 2 diabetes mellitus without complications: Secondary | ICD-10-CM | POA: Diagnosis not present

## 2022-07-23 DIAGNOSIS — M19042 Primary osteoarthritis, left hand: Secondary | ICD-10-CM | POA: Diagnosis not present

## 2022-07-23 DIAGNOSIS — G40909 Epilepsy, unspecified, not intractable, without status epilepticus: Secondary | ICD-10-CM | POA: Diagnosis not present

## 2022-07-23 DIAGNOSIS — M19041 Primary osteoarthritis, right hand: Secondary | ICD-10-CM | POA: Diagnosis not present

## 2022-07-23 DIAGNOSIS — I10 Essential (primary) hypertension: Secondary | ICD-10-CM | POA: Diagnosis not present

## 2022-07-23 DIAGNOSIS — F0280 Dementia in other diseases classified elsewhere without behavioral disturbance: Secondary | ICD-10-CM | POA: Diagnosis not present

## 2022-07-25 DIAGNOSIS — F4310 Post-traumatic stress disorder, unspecified: Secondary | ICD-10-CM | POA: Diagnosis not present

## 2022-07-25 DIAGNOSIS — M170 Bilateral primary osteoarthritis of knee: Secondary | ICD-10-CM | POA: Diagnosis not present

## 2022-07-25 DIAGNOSIS — M19041 Primary osteoarthritis, right hand: Secondary | ICD-10-CM | POA: Diagnosis not present

## 2022-07-25 DIAGNOSIS — M19042 Primary osteoarthritis, left hand: Secondary | ICD-10-CM | POA: Diagnosis not present

## 2022-07-25 DIAGNOSIS — Z7982 Long term (current) use of aspirin: Secondary | ICD-10-CM | POA: Diagnosis not present

## 2022-07-25 DIAGNOSIS — F0280 Dementia in other diseases classified elsewhere without behavioral disturbance: Secondary | ICD-10-CM | POA: Diagnosis not present

## 2022-07-25 DIAGNOSIS — I10 Essential (primary) hypertension: Secondary | ICD-10-CM | POA: Diagnosis not present

## 2022-07-25 DIAGNOSIS — E119 Type 2 diabetes mellitus without complications: Secondary | ICD-10-CM | POA: Diagnosis not present

## 2022-07-25 DIAGNOSIS — G40909 Epilepsy, unspecified, not intractable, without status epilepticus: Secondary | ICD-10-CM | POA: Diagnosis not present

## 2022-07-25 DIAGNOSIS — Z9181 History of falling: Secondary | ICD-10-CM | POA: Diagnosis not present

## 2022-07-25 DIAGNOSIS — Z8616 Personal history of COVID-19: Secondary | ICD-10-CM | POA: Diagnosis not present

## 2022-08-05 DIAGNOSIS — E119 Type 2 diabetes mellitus without complications: Secondary | ICD-10-CM | POA: Diagnosis not present

## 2022-08-05 DIAGNOSIS — G40909 Epilepsy, unspecified, not intractable, without status epilepticus: Secondary | ICD-10-CM | POA: Diagnosis not present

## 2022-08-05 DIAGNOSIS — M19041 Primary osteoarthritis, right hand: Secondary | ICD-10-CM | POA: Diagnosis not present

## 2022-08-05 DIAGNOSIS — I10 Essential (primary) hypertension: Secondary | ICD-10-CM | POA: Diagnosis not present

## 2022-08-05 DIAGNOSIS — M19042 Primary osteoarthritis, left hand: Secondary | ICD-10-CM | POA: Diagnosis not present

## 2022-08-05 DIAGNOSIS — F0280 Dementia in other diseases classified elsewhere without behavioral disturbance: Secondary | ICD-10-CM | POA: Diagnosis not present

## 2022-08-07 DIAGNOSIS — I10 Essential (primary) hypertension: Secondary | ICD-10-CM | POA: Diagnosis not present

## 2022-08-07 DIAGNOSIS — G40909 Epilepsy, unspecified, not intractable, without status epilepticus: Secondary | ICD-10-CM | POA: Diagnosis not present

## 2022-08-07 DIAGNOSIS — F0280 Dementia in other diseases classified elsewhere without behavioral disturbance: Secondary | ICD-10-CM | POA: Diagnosis not present

## 2022-08-07 DIAGNOSIS — E119 Type 2 diabetes mellitus without complications: Secondary | ICD-10-CM | POA: Diagnosis not present

## 2022-08-07 DIAGNOSIS — M19041 Primary osteoarthritis, right hand: Secondary | ICD-10-CM | POA: Diagnosis not present

## 2022-08-07 DIAGNOSIS — M19042 Primary osteoarthritis, left hand: Secondary | ICD-10-CM | POA: Diagnosis not present

## 2022-08-21 DIAGNOSIS — M19042 Primary osteoarthritis, left hand: Secondary | ICD-10-CM | POA: Diagnosis not present

## 2022-08-21 DIAGNOSIS — I10 Essential (primary) hypertension: Secondary | ICD-10-CM | POA: Diagnosis not present

## 2022-08-21 DIAGNOSIS — F0280 Dementia in other diseases classified elsewhere without behavioral disturbance: Secondary | ICD-10-CM | POA: Diagnosis not present

## 2022-08-21 DIAGNOSIS — E119 Type 2 diabetes mellitus without complications: Secondary | ICD-10-CM | POA: Diagnosis not present

## 2022-08-21 DIAGNOSIS — M19041 Primary osteoarthritis, right hand: Secondary | ICD-10-CM | POA: Diagnosis not present

## 2022-08-21 DIAGNOSIS — G40909 Epilepsy, unspecified, not intractable, without status epilepticus: Secondary | ICD-10-CM | POA: Diagnosis not present

## 2022-12-08 ENCOUNTER — Telehealth: Payer: Self-pay | Admitting: General Practice

## 2022-12-08 NOTE — Transitions of Care (Post Inpatient/ED Visit) (Signed)
   12/08/2022  Name: Dale Lowe MRN: [id number] DOB: 1946-03-17  Today's TOC FU Call Status: Today's TOC FU Call Status:: Unsuccessul Call (1st Attempt) Unsuccessful Call (1st Attempt) Date: 12/08/22  Attempted to reach the patient regarding the most recent Inpatient/ED visit.  Follow Up Plan: Additional outreach attempts will be made to reach the patient to complete the Transitions of Care (Post Inpatient/ED visit) call.   Signature Tinnie Gens, RN BSN

## 2022-12-09 NOTE — Transitions of Care (Post Inpatient/ED Visit) (Signed)
   12/09/2022  Name: Dale Lowe MRN: [id number] DOB: 05/30/1946  Today's TOC FU Call Status: Today's TOC FU Call Status:: Unsuccessful Call (2nd Attempt) Unsuccessful Call (1st Attempt) Date: 12/08/22 Unsuccessful Call (2nd Attempt) Date: 12/09/22  Attempted to reach the patient regarding the most recent Inpatient/ED visit.  Follow Up Plan: Additional outreach attempts will be made to reach the patient to complete the Transitions of Care (Post Inpatient/ED visit) call.   Signature Tinnie Gens, RN BSN

## 2022-12-10 NOTE — Transitions of Care (Post Inpatient/ED Visit) (Signed)
   12/10/2022  Name: Dale Lowe MRN: [id number] DOB: 1945-12-30  Today's TOC FU Call Status: Today's TOC FU Call Status:: Unsuccessful Call (3rd Attempt) Unsuccessful Call (1st Attempt) Date: 12/08/22 Unsuccessful Call (2nd Attempt) Date: 12/09/22 Unsuccessful Call (3rd Attempt) Date: 12/10/22  Attempted to reach the patient regarding the most recent Inpatient/ED visit.  Follow Up Plan: No further outreach attempts will be made at this time. We have been unable to contact the patient.  Signature Tinnie Gens, RN BSN

## 2024-09-27 ENCOUNTER — Encounter: Payer: Self-pay | Admitting: Physician Assistant

## 2024-10-21 ENCOUNTER — Ambulatory Visit: Admitting: Physician Assistant
